# Patient Record
Sex: Female | Born: 1945
Health system: Southern US, Community
[De-identification: ages and names within clinical notes are randomized; demographics above are authoritative.]

## PROBLEM LIST (undated history)

## (undated) DIAGNOSIS — I714 Abdominal aortic aneurysm, without rupture, unspecified: Secondary | ICD-10-CM

## (undated) DIAGNOSIS — Z720 Tobacco use: Secondary | ICD-10-CM

## (undated) DIAGNOSIS — M1990 Unspecified osteoarthritis, unspecified site: Secondary | ICD-10-CM

## (undated) DIAGNOSIS — Z9289 Personal history of other medical treatment: Secondary | ICD-10-CM

## (undated) DIAGNOSIS — M316 Other giant cell arteritis: Secondary | ICD-10-CM

## (undated) DIAGNOSIS — I779 Disorder of arteries and arterioles, unspecified: Secondary | ICD-10-CM

## (undated) DIAGNOSIS — E7800 Pure hypercholesterolemia, unspecified: Secondary | ICD-10-CM

## (undated) DIAGNOSIS — I10 Essential (primary) hypertension: Secondary | ICD-10-CM

## (undated) DIAGNOSIS — I739 Peripheral vascular disease, unspecified: Secondary | ICD-10-CM

## (undated) HISTORY — DX: Personal history of other medical treatment: Z92.89

## (undated) HISTORY — DX: Unspecified osteoarthritis, unspecified site: M19.90

## (undated) HISTORY — DX: Abdominal aortic aneurysm, without rupture: I71.4

## (undated) HISTORY — DX: Disorder of arteries and arterioles, unspecified: I77.9

## (undated) HISTORY — DX: Peripheral vascular disease, unspecified: I73.9

## (undated) HISTORY — PX: HEMORROIDECTOMY: SUR656

## (undated) HISTORY — DX: Abdominal aortic aneurysm, without rupture, unspecified: I71.40

## (undated) HISTORY — DX: Tobacco use: Z72.0

## (undated) HISTORY — PX: BREAST SURGERY: SHX581

## (undated) HISTORY — DX: Essential (primary) hypertension: I10

---

## 2004-01-10 HISTORY — PX: TEMPORAL ARTERY BIOPSY / LIGATION: SUR132

## 2004-04-08 ENCOUNTER — Ambulatory Visit (HOSPITAL_COMMUNITY): Admission: RE | Admit: 2004-04-08 | Discharge: 2004-04-08 | Payer: Self-pay | Admitting: Family Medicine

## 2004-04-20 ENCOUNTER — Ambulatory Visit (HOSPITAL_COMMUNITY): Admission: RE | Admit: 2004-04-20 | Discharge: 2004-04-20 | Payer: Self-pay | Admitting: Internal Medicine

## 2004-07-06 ENCOUNTER — Ambulatory Visit (HOSPITAL_COMMUNITY): Admission: RE | Admit: 2004-07-06 | Discharge: 2004-07-06 | Payer: Self-pay | Admitting: General Surgery

## 2004-07-06 ENCOUNTER — Encounter (INDEPENDENT_AMBULATORY_CARE_PROVIDER_SITE_OTHER): Payer: Self-pay | Admitting: General Surgery

## 2006-01-09 HISTORY — PX: WRIST SURGERY: SHX841

## 2006-08-06 ENCOUNTER — Emergency Department (HOSPITAL_COMMUNITY): Admission: EM | Admit: 2006-08-06 | Discharge: 2006-08-06 | Payer: Self-pay | Admitting: Emergency Medicine

## 2006-08-08 ENCOUNTER — Ambulatory Visit: Payer: Self-pay | Admitting: Orthopedic Surgery

## 2006-08-10 ENCOUNTER — Ambulatory Visit (HOSPITAL_COMMUNITY): Admission: RE | Admit: 2006-08-10 | Discharge: 2006-08-10 | Payer: Self-pay | Admitting: Orthopedic Surgery

## 2006-08-10 ENCOUNTER — Ambulatory Visit: Payer: Self-pay | Admitting: Orthopedic Surgery

## 2006-08-14 ENCOUNTER — Ambulatory Visit: Payer: Self-pay | Admitting: Orthopedic Surgery

## 2006-08-22 ENCOUNTER — Ambulatory Visit: Payer: Self-pay | Admitting: Orthopedic Surgery

## 2006-09-05 ENCOUNTER — Ambulatory Visit: Payer: Self-pay | Admitting: Orthopedic Surgery

## 2006-10-04 ENCOUNTER — Ambulatory Visit: Payer: Self-pay | Admitting: Orthopedic Surgery

## 2006-11-12 ENCOUNTER — Ambulatory Visit: Payer: Self-pay | Admitting: Orthopedic Surgery

## 2006-11-12 DIAGNOSIS — S52539A Colles' fracture of unspecified radius, initial encounter for closed fracture: Secondary | ICD-10-CM | POA: Insufficient documentation

## 2006-11-19 ENCOUNTER — Ambulatory Visit (HOSPITAL_COMMUNITY): Admission: RE | Admit: 2006-11-19 | Discharge: 2006-11-19 | Payer: Self-pay | Admitting: Orthopedic Surgery

## 2006-11-22 ENCOUNTER — Encounter: Payer: Self-pay | Admitting: Orthopedic Surgery

## 2006-12-11 ENCOUNTER — Telehealth: Payer: Self-pay | Admitting: Orthopedic Surgery

## 2008-09-29 ENCOUNTER — Ambulatory Visit (HOSPITAL_COMMUNITY): Admission: RE | Admit: 2008-09-29 | Discharge: 2008-09-29 | Payer: Self-pay | Admitting: Internal Medicine

## 2010-02-08 NOTE — Progress Notes (Signed)
Summary: initial visit  initial visit   Imported By: Jacklynn Ganong 11/09/2006 09:19:13  _____________________________________________________________________  External Attachment:    Type:   Image     Comment:   initial visit

## 2010-05-10 DIAGNOSIS — Z9289 Personal history of other medical treatment: Secondary | ICD-10-CM

## 2010-05-10 HISTORY — DX: Personal history of other medical treatment: Z92.89

## 2010-05-24 NOTE — H&P (Signed)
Elizabeth Meza, Elizabeth Meza                   ACCOUNT NO.:  [id number]   MEDICAL RECORD NO.:  [id number]          PATIENT TYPE:  AMB   LOCATION:  DAY                           FACILITY:  [facility]   PHYSICIAN:  Vickki Hearing, M.D.DATE OF BIRTH:  January 09, 1946   DATE OF ADMISSION:  DATE OF DISCHARGE:  LH                              HISTORY & PHYSICAL   CHIEF COMPLAINT:  Left wrist pain.   HISTORY:  This is a 65 year old female who fell on July 28 and fractured  her left wrist.  Onset of pain was acute.  Severity was a 10, timing  intermittent, quality sharp, with no radiation.  Worsened by movement,  improved with rest.  Associated signs and symptoms:  Swelling.   She is left-hand dominant.   Percocet causes her to be too sedate.  She does not have any medical  problems.  She has had a temporal biopsy for temporal arteritis.  Took  steroids for a year.   FAMILY PHYSICIAN:  Dr. Sherwood Gambler.   Currently takes no medications.   FAMILY HISTORY:  Mother who had a fractured hip.  She has not had a bone  density done yet.   SOCIAL HISTORY:  She is married, retired, does not smoke or drink.  Caffeine use:  Yes.  High school education was completed.   REVIEW OF SYSTEMS:  She complains of occasional headaches, some skin  eczema, seasonal allergies.  Denied constitutional symptoms:  Cardiac,  respiratory, GI symptoms, urinary, endocrine, psychiatric, ear, nose and  throat, or lymphatic system findings.  Weight is 185, pulse 72,  respiratory rate 16.  Appearance was normal.  Peripheral Vascular  System:  No swelling or varicose veins.  Normal pulses and temperature  without edema.  Lymph Nodes:  Cervical normal.  Gait and station normal.   Left wrist is deformed.  It is swollen.  It is tender.  Her range of  motion could not be assessed.  Stability could not be tested.  Strength  could not be tested but muscle tone was normal.   There were no other injuries.  Her other extremities had appropriate  alignment without contracture, subluxation, atrophy or tremor.  Skin x4  normal except for the subcutaneous hemorrhage from the fracture on the  left wrist.   Coordination test had to be deferred.  Deep tendon reflexes had to be  deferred.  Sensation was normal.  She was oriented x3.  Mood and affect  were normal.   The x-rays show a comminuted intra-articular fracture of the left distal  radius.   PLAN:  Is for closed reduction external fixation, percutaneous pinning  on August 10, 2006 with a follow-up scheduled for August 14, 2006.   The informed consent process was completed.  We told her her diagnosis  of a comminuted intra-articular fracture of the left distal radius.  I  told her about the treatment planned, which was closed reduction,  percutaneous pinning.  I told her about the most likely and significant  risks which include stiffness, continued pain, possibility for more  surgery, arthritis, infection.  Benefits of surgery:  Improved alignment and better function of the left  hand.  Alternatives would be casting with, most likely, loss of  reduction and then open treatment with plating which could lead to  further compromise of the bone stock.   I gave the patient the opportunity to ask questions and answered them in  the presence of her husband.      Vickki Hearing, M.D.  Electronically Signed     SEH/MEDQ  D:  08/09/2006  T:  08/09/2006  Job:  [id number]

## 2010-05-24 NOTE — Op Note (Signed)
NAMEREIS, PIENTA                   ACCOUNT NO.:  [id number]   MEDICAL RECORD NO.:  [id number]          PATIENT TYPE:  AMB   LOCATION:  DAY                           FACILITY:  [facility]   PHYSICIAN:  Vickki Hearing, M.D.DATE OF BIRTH:  [birth year]   DATE OF PROCEDURE:  08/10/2006  DATE OF DISCHARGE:                               OPERATIVE REPORT   PREOPERATIVE DIAGNOSIS:  Closed left distal radius fracture.   POSTOPERATIVE DIAGNOSIS:  Closed left distal radius fracture.   PROCEDURE:  Closed reduction, external fixation, percutaneous pinning,  left wrist.   SURGEON:  Vickki Hearing, M.D.   ASSISTANT:  No assistants.   ANESTHETIC:  General.   OPERATIVE FINDINGS:  Comminuted intra-articular fracture with  displacement of left distal radius; fracture was closed.   HISTORY:  This 65 year old female fell and fractured her left wrist,  presented to the office complaining of pain and swelling, radiographs  and clinical exam reviewed, recommended surgery.   The patient was identified in the preop area, her left upper extremity  marked as the surgical site and I countersigned it and updated her  history and physical.  She was taken to surgery, had general anesthesia.  Before manipulation, a time-out procedure was completed to confirm the  procedure.  We then prepped with DuraPrep, draped sterilely, repeated a  closed manipulation under C-arm and then placed one K-wire between the  distal radius and proximal shaft fragment, then applied external  fixation to maintain length, applied a second pin to control rotation.  Radiographs confirmed our reduction.   The fixator was applied by making an incision over the 2nd metacarpal,  taking that down to bone bluntly, applying 2 half pins.  We then did the  same and the distal third of the radius, applied the fixator, and then  checked the reduction under the C-arm on AP and lateral using a flexion  lateral or lunate fossa view as  well.   Wounds were closed with 3-0 nylon, injected with 0.5% plain Marcaine.  Sterile bandages were applied.  The patient was extubated and taken to  recovery room in stable condition.   I would like to keep the fixator on for 8 weeks.   She will come back next week for dressing change.  She is on Vicodin for  pain and some Phenergan in case she gets nauseated.      Vickki Hearing, M.D.  Electronically Signed     SEH/MEDQ  D:  08/10/2006  T:  08/11/2006  Job:  [id number]

## 2010-05-27 NOTE — Op Note (Signed)
Elizabeth, Meza                   ACCOUNT NO.:  [id number]   MEDICAL RECORD NO.:  [id number]          PATIENT TYPE:  AMB   LOCATION:  DAY                           FACILITY:  [facility]   PHYSICIAN:  Barbaraann Barthel, M.D. DATE OF BIRTH:  08-11-1945   DATE OF PROCEDURE:  07/06/2004  DATE OF DISCHARGE:                                 OPERATIVE REPORT   PREPROCEDURE DIAGNOSIS:  Temporal arteritis.   PROCEDURE:  Left temporal artery biopsy.   SPECIMEN:  Left temporal artery.   INDICATIONS:  This is a 65 year old white female who had recurrent left-  sided headaches.  These were primarily left-sided headaches although she had  bilateral headaches, the pain was worse on the worse on the left side.  She  had seen the medical service and then neurology service, and the impression  was that she may have temporal arteritis.  She was referred to me for a  temporal artery biopsy.  At one point it interesting to note that she was  treated with steroids for her pain with some alleviation with a recurrence  of pain with a steroids were discontinued.   GROSS OPERATIVE FINDINGS:  The patient had a somewhat torturous temporal  artery.  We removed at least 1.5 cm and  sent for frozen section, and this  revealed a positive diagnosis with giant cells present.   OPERATIVE TECHNIQUE:  The patient was placed in the supine position with the  head turned to the right side.  An  area around her left temple was prepped  with Betadine solution and draped in the usual manner.  A small amount of  hair was removed this area prior to this.  The artery was encountered using  the handheld Doppler, and the incision was made through skin,  subcutaneous  tissue down to the fascia where the temporal artery was encountered.  This  was isolated superiorly and proximally and ligated with 4-0 silk and removed  and as a specimen for frozen section with the above diagnosis.  The wound  was then irrigated with normal saline  solution and the skin was  reapproximated with subcuticular 4-0 Vicryl suture.  A Steri-Strip was with  Neosporin was applied.  Prior to closure, all sponge needle and instrument  counts were found to be correct.  Estimated blood loss was minimal.  The  patient received 900 mL of crystalloids intraoperatively.  There were no  complications.       WB/MEDQ  D:  07/06/2004  T:  07/06/2004  Job:  [id number]   cc:   Madelin Rear. Sherwood Gambler, MD  [address]  [place]  [place] [address]  Fax: [phone]   Dr. Neale Burly  Neurology Service

## 2010-05-27 NOTE — Procedures (Signed)
NAMETEMPEST, FRANKLAND                   ACCOUNT NO.:  [id number]   MEDICAL RECORD NO.:  [id number]         PATIENT TYPE:  PAMB   LOCATION:                                FACILITY:  [facility]   PHYSICIAN:  Edward L. Juanetta Gosling, M.D.DATE OF BIRTH:  21-Oct-1945   DATE OF PROCEDURE:  07/05/2004  DATE OF DISCHARGE:                                EKG INTERPRETATION   The rhythm is sinus rhythm with a rate in the 80s.  PR interval is short  suggesting accelerated AV conduction.  There is an incomplete right bundle  branch block.  Small Q-waves are seen inferiorly.  Her ST-T wave  abnormalities are nonspecific and diffuse.  Abnormal electrocardiogram.       ELH/MEDQ  D:  07/05/2004  T:  07/06/2004  Job:  [id number]

## 2010-07-22 ENCOUNTER — Encounter: Payer: Self-pay | Admitting: *Deleted

## 2010-07-22 ENCOUNTER — Emergency Department (HOSPITAL_COMMUNITY): Payer: Medicare Other

## 2010-07-22 ENCOUNTER — Emergency Department (HOSPITAL_COMMUNITY)
Admission: EM | Admit: 2010-07-22 | Discharge: 2010-07-22 | Disposition: A | Discharge status: Home / Self Care | Payer: Medicare Other | Attending: Emergency Medicine | Admitting: Emergency Medicine

## 2010-07-22 DIAGNOSIS — N201 Calculus of ureter: Secondary | ICD-10-CM | POA: Insufficient documentation

## 2010-07-22 DIAGNOSIS — F17200 Nicotine dependence, unspecified, uncomplicated: Secondary | ICD-10-CM | POA: Insufficient documentation

## 2010-07-22 DIAGNOSIS — R109 Unspecified abdominal pain: Secondary | ICD-10-CM | POA: Insufficient documentation

## 2010-07-22 HISTORY — DX: Other giant cell arteritis: M31.6

## 2010-07-22 HISTORY — DX: Pure hypercholesterolemia, unspecified: E78.00

## 2010-07-22 LAB — DIFFERENTIAL
Basophils Absolute: 0 10*3/uL (ref 0.0–0.1)
Basophils Relative: 0 % (ref 0–1)
Lymphocytes Relative: 10 % — ABNORMAL LOW (ref 12–46)
Lymphs Abs: 0.9 10*3/uL (ref 0.7–4.0)

## 2010-07-22 LAB — URINE MICROSCOPIC-ADD ON

## 2010-07-22 LAB — BASIC METABOLIC PANEL
BUN: 13 mg/dL (ref 6–23)
GFR calc Af Amer: >60 mL/min (ref >60)
GFR calc non Af Amer: >60 mL/min (ref >60)
Glucose, Bld: 168 mg/dL — ABNORMAL HIGH (ref 70–99)
Sodium: 140 mEq/L (ref 135–145)

## 2010-07-22 LAB — URINALYSIS, ROUTINE W REFLEX MICROSCOPIC
Glucose, UA: NEGATIVE mg/dL
Leukocytes, UA: NEGATIVE
Nitrite: NEGATIVE
Protein, ur: NEGATIVE mg/dL
Specific Gravity, Urine: >1.03 — ABNORMAL HIGH (ref 1.005–1.030)
Urobilinogen, UA: 0.2 mg/dL (ref 0.0–1.0)
pH: 5 (ref 5.0–8.0)

## 2010-07-22 LAB — CBC
Hemoglobin: 14.6 g/dL (ref 12.0–15.0)
MCH: 31.3 pg (ref 26.0–34.0)
RBC: 4.67 MIL/uL (ref 3.87–5.11)
WBC: 8.8 10*3/uL (ref 4.0–10.5)

## 2010-07-22 MED ORDER — ONDANSETRON HCL 4 MG/2ML IJ SOLN
4.0000 mg | Freq: Once | INTRAMUSCULAR | Status: AC
  Administered 2010-07-22: 4 mg via INTRAVENOUS
  Filled 2010-07-22: qty 2

## 2010-07-22 MED ORDER — HYDROMORPHONE HCL 1 MG/ML IJ SOLN
1.0000 mg | Freq: Once | INTRAMUSCULAR | Status: AC
  Administered 2010-07-22: 1 mg via INTRAVENOUS
  Filled 2010-07-22: qty 1

## 2010-07-22 MED ORDER — HYDROCODONE-ACETAMINOPHEN 5-325 MG PO TABS: 1.0000 | ORAL_TABLET | ORAL | Status: AC | PRN

## 2010-07-22 MED ORDER — ONDANSETRON HCL 4 MG PO TABS: 4.0000 mg | ORAL_TABLET | Freq: Four times a day (QID) | ORAL | Status: AC

## 2010-07-22 MED ORDER — SODIUM CHLORIDE 0.9 % IV SOLN
INTRAVENOUS | Status: DC
  Administered 2010-07-22 (×2): via INTRAVENOUS

## 2010-07-22 NOTE — ED Notes (Signed)
Pt c/o right flank pain that radiates around to the abd; pt states she has seen some blood in her urine

## 2010-07-22 NOTE — ED Notes (Signed)
Resting in bed, voices no complaits.

## 2010-07-22 NOTE — ED Provider Notes (Signed)
History     Chief Complaint  Patient presents with  . Flank Pain    right flank pain radiating around to the front   Patient is a 65 y.o. female presenting with flank pain. The history is provided by the patient.  Flank Pain This is a new problem. The current episode started 3 to 5 hours ago. The problem occurs constantly. The problem has been gradually worsening. Associated symptoms include abdominal pain. Pertinent negatives include no chest pain, no headaches and no shortness of breath. The symptoms are aggravated by nothing. The symptoms are relieved by nothing. She has tried nothing for the symptoms.  RIGHT FLANK PAIN RADIATING INTO RLQ AREA. NO HX OF KIDNEY STONES.   Past Medical History  Diagnosis Date  . Hypercholesteremia   . Temporal arteritis     Past Surgical History  Procedure Date  . Wrist surgery   . Hemorroidectomy     History reviewed. No pertinent family history.  History  Substance Use Topics  . Smoking status: Current Some Day Smoker  . Smokeless tobacco: Not on file  . Alcohol Use: No    OB History    Grav Para Term Preterm Abortions TAB SAB Ect Mult Living                  Review of Systems  Constitutional: Negative for fever.  HENT: Negative for neck pain.   Eyes: Negative for redness.  Respiratory: Negative for cough, chest tightness and shortness of breath.   Cardiovascular: Negative for chest pain and palpitations.  Gastrointestinal: Positive for nausea and abdominal pain. Negative for vomiting.  Genitourinary: Positive for hematuria and flank pain.  Musculoskeletal: Positive for back pain.  Skin: Negative for rash.  Neurological: Negative for headaches.  Psychiatric/Behavioral: Negative for confusion.    Physical Exam  BP 117/68  Pulse 69  Temp(Src) 97.8 F (36.6 C) (Oral)  Resp 14  Ht 5\' 3"  (1.6 m)  Wt 190 lb (86.183 kg)  BMI 33.66 kg/m2  SpO2 94%  Physical Exam  Constitutional: She is oriented to person, place, and time.  She appears well-developed and well-nourished.  HENT:  Head: Normocephalic and atraumatic.  Mouth/Throat: Oropharynx is clear and moist.  Eyes: Conjunctivae and EOM are normal. Pupils are equal, round, and reactive to light.  Neck: Normal range of motion. Neck supple.  Cardiovascular: Normal rate, regular rhythm and normal heart sounds.   No murmur heard. Pulmonary/Chest: Effort normal and breath sounds normal. She exhibits no tenderness.  Abdominal: Soft. Bowel sounds are normal. There is no tenderness.  Musculoskeletal: Normal range of motion. She exhibits no edema.  Neurological: She is alert and oriented to person, place, and time. No cranial nerve deficit. She exhibits normal muscle tone.  Skin: Skin is warm and dry. No rash noted.    ED Course  Procedures  MDM IMPROVED IN ED. CT DEPICTS RIGHT SIDED URETERAL STONE ALMOST IN BLADDER. UROLOGY FOLLOW UP PROVIDED. NO UTI.       Shelda Jakes, MD 07/22/10 [phone]

## 2010-07-22 NOTE — ED Notes (Signed)
Up to bathroom, voices no complaints at this time

## 2010-08-29 ENCOUNTER — Other Ambulatory Visit (HOSPITAL_COMMUNITY): Payer: Self-pay | Admitting: Urology

## 2010-08-29 DIAGNOSIS — N201 Calculus of ureter: Secondary | ICD-10-CM

## 2010-09-19 ENCOUNTER — Ambulatory Visit (HOSPITAL_COMMUNITY)
Admission: RE | Admit: 2010-09-19 | Discharge: 2010-09-19 | Disposition: A | Discharge status: Home / Self Care | Payer: Medicare Other | Source: Ambulatory Visit | Attending: Urology | Admitting: Urology

## 2010-09-19 DIAGNOSIS — N201 Calculus of ureter: Secondary | ICD-10-CM

## 2010-09-19 DIAGNOSIS — K5730 Diverticulosis of large intestine without perforation or abscess without bleeding: Secondary | ICD-10-CM | POA: Insufficient documentation

## 2010-09-19 DIAGNOSIS — N200 Calculus of kidney: Secondary | ICD-10-CM | POA: Insufficient documentation

## 2010-09-19 DIAGNOSIS — I77812 Thoracoabdominal aortic ectasia: Secondary | ICD-10-CM | POA: Insufficient documentation

## 2010-09-19 DIAGNOSIS — Q619 Cystic kidney disease, unspecified: Secondary | ICD-10-CM | POA: Insufficient documentation

## 2010-09-19 DIAGNOSIS — R109 Unspecified abdominal pain: Secondary | ICD-10-CM | POA: Insufficient documentation

## 2010-10-07 HISTORY — PX: TRANSTHORACIC ECHOCARDIOGRAM: SHX275

## 2010-10-24 LAB — BASIC METABOLIC PANEL
BUN: 11
CO2: 24
Calcium: 8.8
Chloride: 109
GFR calc non Af Amer: >60

## 2010-10-24 LAB — HEMOGLOBIN AND HEMATOCRIT, BLOOD: HCT: 45.2

## 2011-08-25 IMAGING — CT CT ABD-PELV W/O CM
2 of 4 series · 16 of 46 positions shown, 18 images · non-contrast
Comparison: None.

CLINICAL DATA: Right flank pain

CT ABDOMEN AND PELVIS WITHOUT CONTRAST
TECHNIQUE: Multidetector CT imaging of the abdomen and pelvis was
performed following the standard protocol without intravenous
contrast.

[Series 2: standard/full over (age)lbs 5.0 · axial · 0.78mm/px · z∈[-428,-43]mm · 13 of 85 slices shown, 15 images]
[im 4/85  soft-tissue]
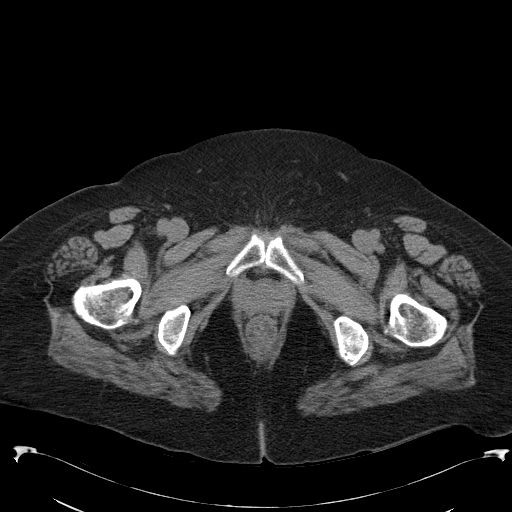
[im 4/85  bone]
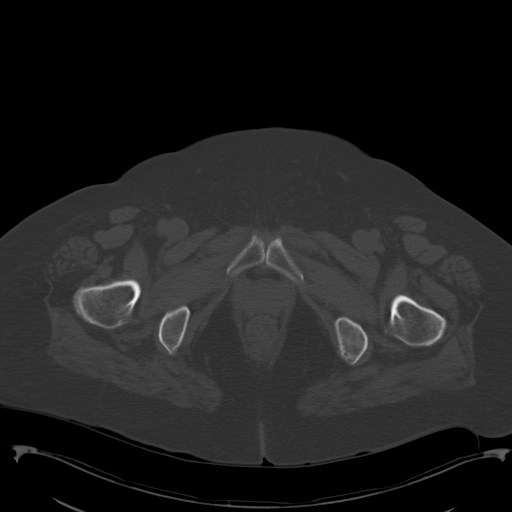
[im 11/85  soft-tissue]
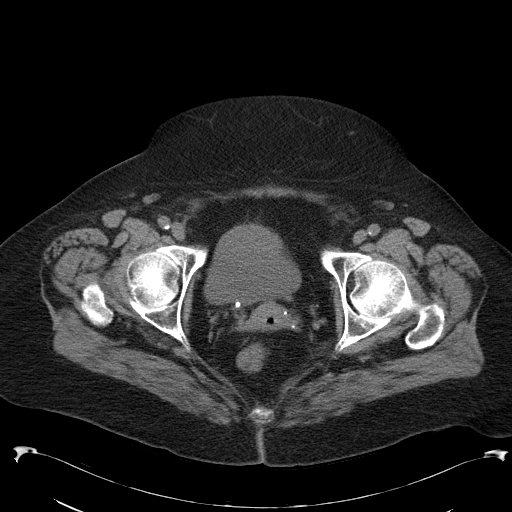
[im 18/85  soft-tissue]
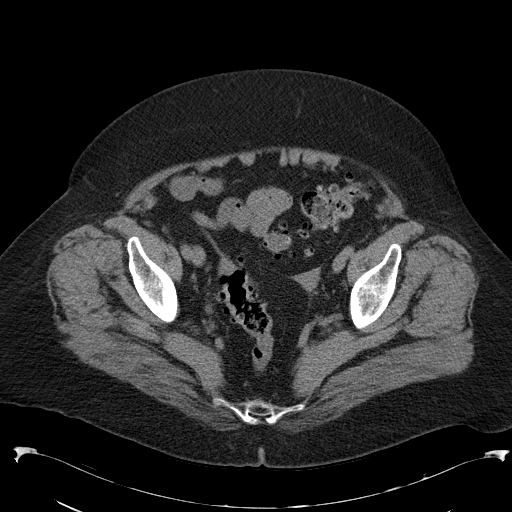
[im 25/85  soft-tissue]
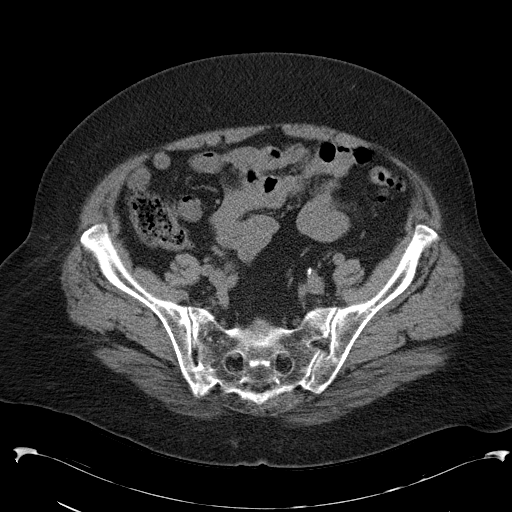
[im 29/85  soft-tissue]
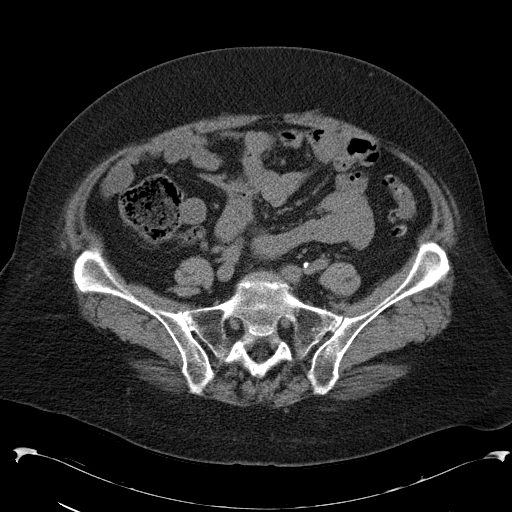
[im 36/85  soft-tissue]
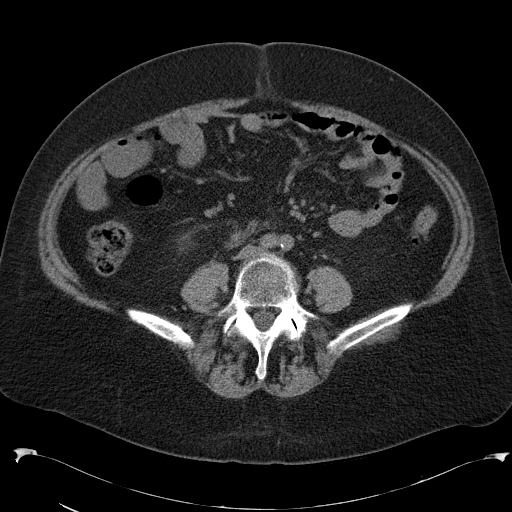
[im 43/85  soft-tissue]
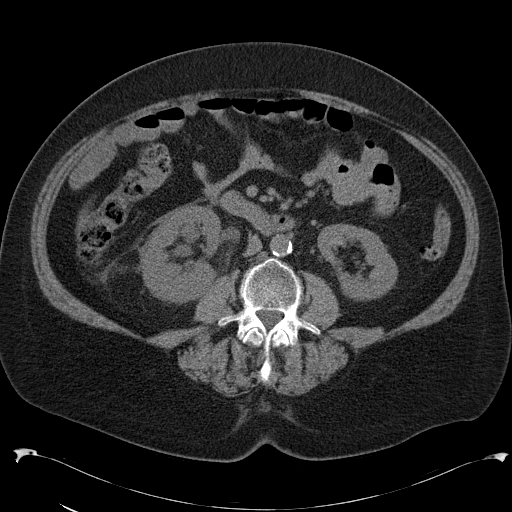
[im 50/85  soft-tissue]
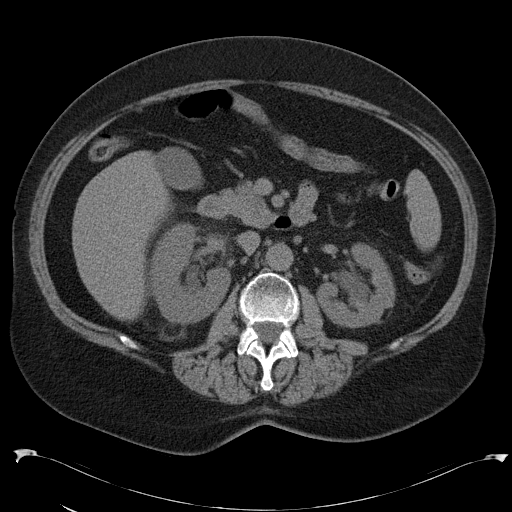
[im 57/85  soft-tissue]
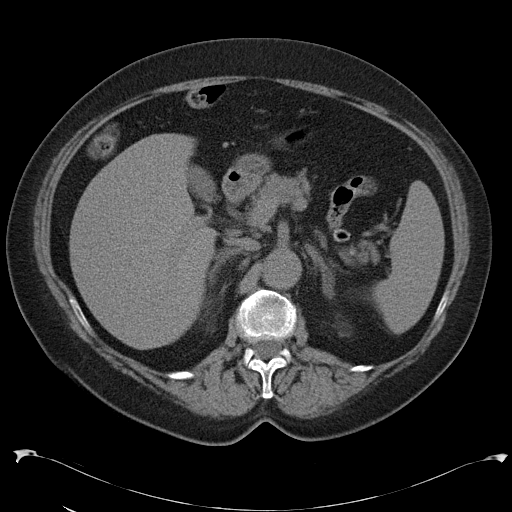
[im 57/85  bone]
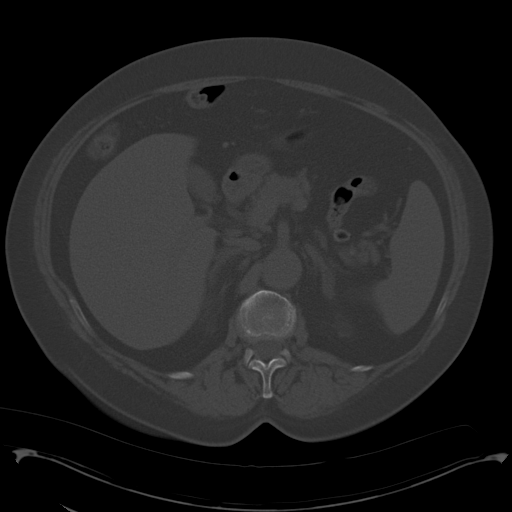
[im 60/85  soft-tissue]
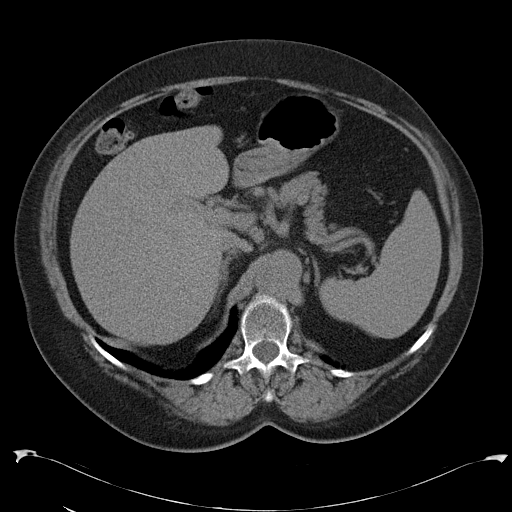
[im 67/85  soft-tissue]
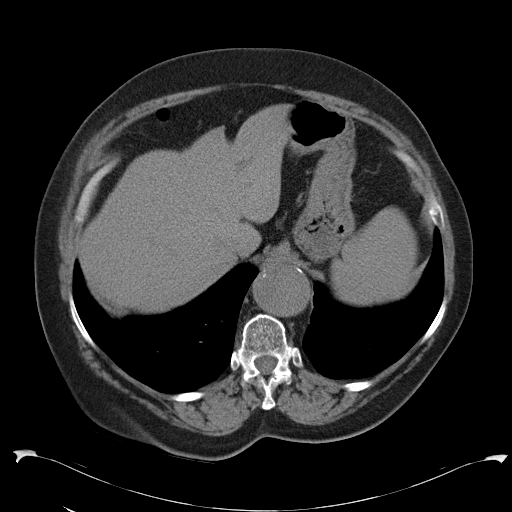
[im 74/85  soft-tissue]
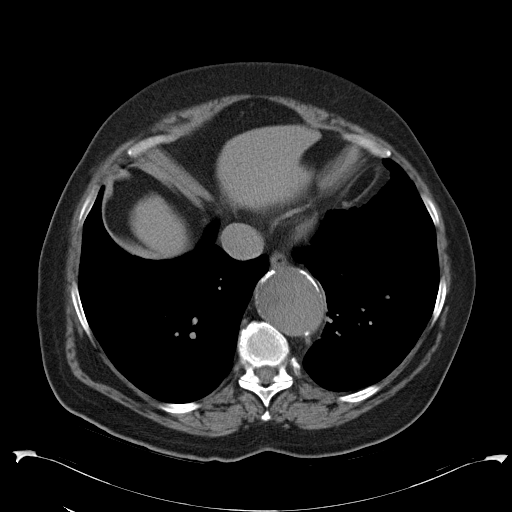
[im 81/85  soft-tissue]
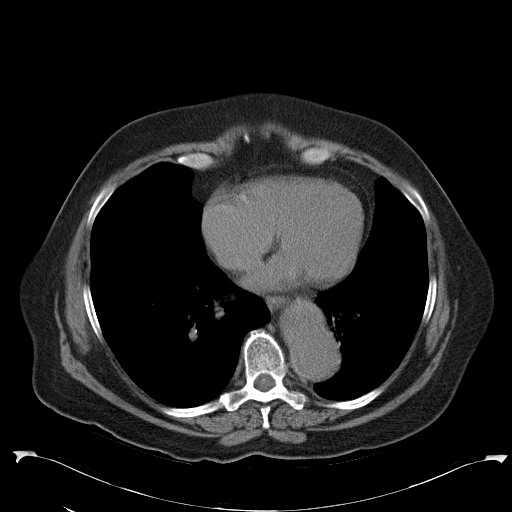

[Series 4: mpr coronal · coronal · 0.74mm/px · 3 of 102 slices shown]
[im 34/102  soft-tissue]
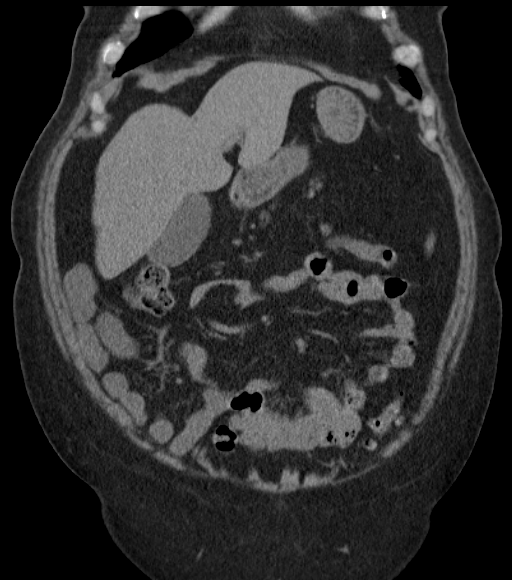
[im 45/102  soft-tissue]
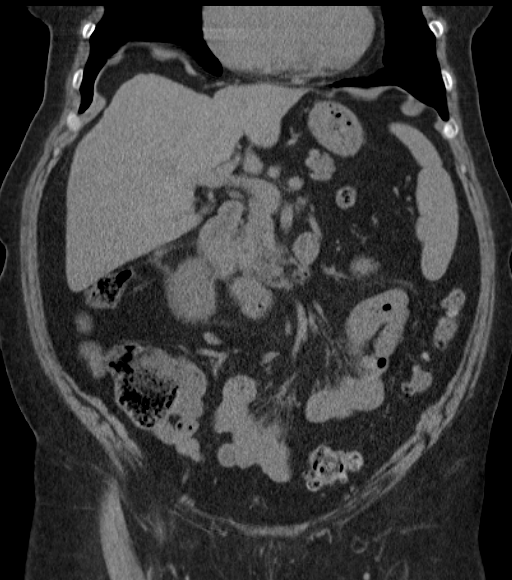
[im 57/102  soft-tissue]
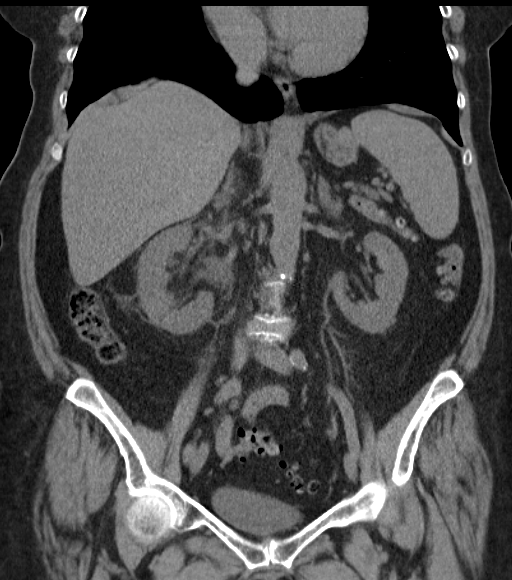

[16 of 46 positions shown; findings below may reference images not displayed]

FINDINGS: There is a 4 mm stone in the distal right ureter at the
ureterovesicle junction with moderate proximal right ureterectasis
and pyelocaliectasis as well as stranding around the right ureter
and right kidney consistent with moderately obstructing stone.  No
intrarenal stones are demonstrated in either kidney.  Parapelvic
cysts are noted on the left kidney.  No bladder stones are
visualized.

The unenhanced liver, spleen, gallbladder, bile ducts, pancreas,
adrenal glands, stomach, small bowel, and retroperitoneal lymph
nodes are unremarkable.  No free fluid or free air in the abdomen.
The abdominal aorta is normal in caliber with mild calcification.
The lower thoracic aorta demonstrates tortuosity and ectasia with
maximal diameter of about 5 cm.

Pelvis:  Diverticulosis of the colon without inflammatory change.
The appendix is normal.  No free or loculated pelvic fluid
collections.  The uterus and adnexal structures are not enlarged.
Mild degenerative changes in the pelvis with intervertebral disc
calcification in the lumbosacral interspace.
IMPRESSION: 4 mm mildly obstructing stone in the distal right ureter at the
ureterovesicle junction.  No stone or obstruction on the left.
Lower thoracic aorta is not dilated and tortuous with maximal
diameter of about 5 cm.

## 2011-10-12 ENCOUNTER — Other Ambulatory Visit (HOSPITAL_COMMUNITY): Payer: Self-pay | Admitting: Internal Medicine

## 2011-10-12 DIAGNOSIS — I714 Abdominal aortic aneurysm, without rupture: Secondary | ICD-10-CM

## 2011-10-12 DIAGNOSIS — M316 Other giant cell arteritis: Secondary | ICD-10-CM | POA: Diagnosis not present

## 2011-10-12 DIAGNOSIS — I719 Aortic aneurysm of unspecified site, without rupture: Secondary | ICD-10-CM | POA: Diagnosis not present

## 2011-10-12 DIAGNOSIS — E782 Mixed hyperlipidemia: Secondary | ICD-10-CM | POA: Diagnosis not present

## 2011-10-13 DIAGNOSIS — Z79899 Other long term (current) drug therapy: Secondary | ICD-10-CM | POA: Diagnosis not present

## 2011-10-13 DIAGNOSIS — E782 Mixed hyperlipidemia: Secondary | ICD-10-CM | POA: Diagnosis not present

## 2011-10-17 ENCOUNTER — Ambulatory Visit (HOSPITAL_COMMUNITY)
Admission: RE | Admit: 2011-10-17 | Discharge: 2011-10-17 | Disposition: A | Discharge status: Home / Self Care | Payer: Medicare Other | Source: Ambulatory Visit | Attending: Internal Medicine | Admitting: Internal Medicine

## 2011-10-17 DIAGNOSIS — I714 Abdominal aortic aneurysm, without rupture, unspecified: Secondary | ICD-10-CM | POA: Diagnosis not present

## 2011-10-17 DIAGNOSIS — I712 Thoracic aortic aneurysm, without rupture, unspecified: Secondary | ICD-10-CM | POA: Diagnosis not present

## 2011-10-17 DIAGNOSIS — I716 Thoracoabdominal aortic aneurysm, without rupture, unspecified: Secondary | ICD-10-CM | POA: Insufficient documentation

## 2011-10-17 MED ORDER — IOHEXOL 350 MG/ML SOLN
100.0000 mL | Freq: Once | INTRAVENOUS | Status: AC | PRN
  Administered 2011-10-17: 100 mL via INTRAVENOUS

## 2011-10-31 ENCOUNTER — Institutional Professional Consult (permissible substitution) (INDEPENDENT_AMBULATORY_CARE_PROVIDER_SITE_OTHER): Payer: Medicare Other | Admitting: Thoracic Surgery (Cardiothoracic Vascular Surgery)

## 2011-10-31 ENCOUNTER — Encounter: Payer: Self-pay | Admitting: *Deleted

## 2011-10-31 ENCOUNTER — Encounter: Payer: Self-pay | Admitting: Thoracic Surgery (Cardiothoracic Vascular Surgery)

## 2011-10-31 VITALS — BP 133/76 | HR 76 | Resp 18 | Ht 62.0 in | Wt 195.0 lb

## 2011-10-31 DIAGNOSIS — I712 Thoracic aortic aneurysm, without rupture, unspecified: Secondary | ICD-10-CM | POA: Diagnosis not present

## 2011-10-31 DIAGNOSIS — I7123 Aneurysm of the descending thoracic aorta, without rupture: Secondary | ICD-10-CM | POA: Insufficient documentation

## 2011-10-31 NOTE — Progress Notes (Signed)
PCP is Cassell Smiles., MD Referring Provider is Chrystie Nose., MD  Chief Complaint  Patient presents with  . Thoracic Aortic Aneurysm    Referral from Dr Rennis Golden for surgical eval on Thoraco-abdominal aneurysm, CTA Chest/ABD/Pelvis on 10/17/11      HPI: Elizabeth Meza is a 66 year old woman who presents with chief complaint of an aneurysm.  Elizabeth Meza was having a CT scan done for evaluation of a kidney stone last year. That scan showed an aneurysm in her upper abdomen that measured 5 cm in diameter. She was advised to have a repeat CT which was done recently. This time CT angiography of the chest and abdomen was performed. It revealed a 5.8 cm a descending aortic aneurysm as well as a complex 5.8 cm descending thoracic aneurysm. The intervening arch is relatively normal in size. She has no symptoms referable to the aneurysms. She denies chest or back pain. She does get short of breath with heavy exertion such as walking up along incline, but does not have shortness of breath with normal activities including walking up a flight of stairs. She does smoke half a pack of cigarettes daily. She says she quit for a while last year, but started again recently.  Past Medical History  Diagnosis Date  . Hypercholesteremia   . Temporal arteritis   . Abdominal aneurysm   . Thoracic aortic aneurysm without mention of rupture   . Arthritis   . Hypertension     Past Surgical History  Procedure Date  . Wrist surgery   . Hemorroidectomy   . Breast surgery     Biopsy    Family History  Problem Relation Age of Onset  . COPD Mother   . Cancer Father   . Heart disease Sister   . Hyperlipidemia Sister   . Hypertension Sister   . Heart disease Brother   . Stroke Maternal Grandfather   . Kidney disease Maternal Grandfather     Social History History  Substance Use Topics  . Smoking status: Current Some Day Smoker -- 0.5 packs/day for 40 years  . Smokeless tobacco: Not on file  . Alcohol Use: No     Current Outpatient Prescriptions  Medication Sig Dispense Refill  . atenolol (TENORMIN) 50 MG tablet Take 50 mg by mouth daily.      Marland Kitchen buPROPion (WELLBUTRIN SR) 150 MG 12 hr tablet Take 150 mg by mouth 2 (two) times daily.        Marland Kitchen doxylamine, Sleep, (UNISOM) 25 MG tablet Take 25 mg by mouth at bedtime as needed.      . meloxicam (MOBIC) 7.5 MG tablet Take 7.5 mg by mouth daily. PRN ONLY      . simvastatin (ZOCOR) 20 MG tablet Take 20 mg by mouth at bedtime.        . vitamin B-12 (CYANOCOBALAMIN) 100 MCG tablet Take 50 mcg by mouth daily.        Allergies  Allergen Reactions  . Sulfa Antibiotics Rash    Review of Systems  Constitutional: Negative.   HENT: Negative.   Eyes:       Floaters  Respiratory: Positive for shortness of breath (with heavy exertion or walking up inclines). Negative for chest tightness.   Cardiovascular: Positive for leg swelling. Negative for chest pain.  Gastrointestinal: Negative.   Genitourinary:       Kidney stones  Musculoskeletal: Positive for arthralgias.  Neurological: Negative.   Hematological: Negative.   All other systems reviewed and are negative.  BP 133/76  Pulse 76  Resp 18  Ht 5\' 2"  (1.575 m)  Wt 195 lb (88.451 kg)  BMI 35.67 kg/m2  SpO2 96% Physical Exam  Vitals reviewed. Constitutional: She is oriented to person, place, and time. No distress.       obese  HENT:  Head: Normocephalic and atraumatic.  Eyes: EOM are normal. Pupils are equal, round, and reactive to light.  Neck: Neck supple. No JVD present. No thyromegaly present.       No bruits  Cardiovascular: Normal rate, regular rhythm and intact distal pulses.  Exam reveals no gallop and no friction rub.   Murmur (faint diastolic murmur) heard. Pulmonary/Chest: Breath sounds normal. She has no wheezes. She has no rales.  Abdominal: Soft. There is no tenderness.  Musculoskeletal: She exhibits no edema.  Lymphadenopathy:    She has no cervical adenopathy.    Neurological: She is alert and oriented to person, place, and time. No cranial nerve deficit.  Skin: Skin is warm and dry.     Diagnostic Tests: CT angiogram of chest and abdomen 10/17/2011 CT ANGIOGRAPHY CHEST, ABDOMEN AND PELVIS  Technique: Multidetector CT imaging through the chest, abdomen and  pelvis was performed using the standard protocol during bolus  administration of intravenous contrast. Multiplanar reconstructed  images including MIPs were obtained and reviewed to evaluate the  vascular anatomy.  Contrast: OMNIPAQUE IOHEXOL 350 MG/ML SOLN  Comparison: 09/19/2010  CTA CHEST  Findings: The initial noncontrast study shows no hyperdense  crescent or other stigmata of impending rupture. No mediastinal  hemorrhage.  Thoracic aorta measures approximately 3.7 cm diameter at the  sinotubular junction, 5.8 cm proximal ascending, 5 cm distal  ascending/proximal arch, 3.7 cm distal arch. There is a bilobed  descending thoracic aortic aneurysm, 5.7 cm proximal descending, 5  cm mid descending, 5.2 cm distal descending, tapering to a 3.5 cm  below the diaphragm.  The CTA shows no dissection. There is eccentric mural thrombus in  the aneurysmal segments of the descending thoracic aorta. No  significant stenosis. There is classic three-vessel brachiocephalic  arterial origin anatomy without proximal stenosis.  No pleural or pericardial effusion. Sub centimeter AP window and  precarinal lymph nodes. No hilar adenopathy. Mild emphysematous  changes in both upper lobes. No focal nodule or infiltrate.  Review of the MIP images confirms the above findings.  IMPRESSION:  1. 5.8 cm ascending and bilobed 5.8 cm descending thoracic aortic  aneurysm. Consider thoracic surgery consultation.  CTA ABDOMEN AND PELVIS  Arterial findings:  Aorta: Dilated to 5.3 cm at the level of the  diaphragm, tapering to 3.4 cm above the renal arteries.  Intraluminal thrombus extends into the  suprarenal segment of the  abdominal aorta without obstruction. The infrarenal aorta is  normal in caliber with mild atheromatous irregularity. No  dissection or stenosis.  Celiac axis: Proximal narrowing at the level of the  median arcuate ligament diaphragm, widely patent distally.  Superior mesenteric:Widely patent with classic distal branching  anatomy.  Left renal: Single, widely patent  Right renal: Single, widely patent.  Inferior mesenteric:Patent  Left iliac: Mild scattered atheromatous plaque in the  through the common iliac. No aneurysm, dissection, or stenosis.  Right iliac: Scattered calcified plaque through the  common iliac without aneurysm, dissection, or stenosis.  Venous findings: Dedicated venous phase imaging was not obtained.  Review of the MIP images confirms the above findings.  Nonvascular findings: Unremarkable arterial phase evaluation of  liver, nondilated gallbladder, spleen, adrenal glands,  kidneys,  pancreas. Stomach and small bowel are nondilated. Normal  appendix. The colon is nondistended with innumerable descending  and sigmoid diverticula; no adjacent inflammatory/edematous change.  Urinary bladder is incompletely distended. Uterus and adnexal  regions unremarkable. No ascites. No free air. No adenopathy  localized. Mild spondylitic changes at the lumbosacral junction.  IMPRESSION:  1. Thoraco-abdominal aneurysm terminates above the renal arteries.  2. Colonic diverticulosis.   Impression: 66 year old woman with 5.8 cm ascending and descending thoracic aortic aneurysms. These both need to be repaired given their size in relation to the relatively normal sized aorta and proximity to them. The ascending aneurysm involves the takeoff of the innominate artery and will require a brief period of circulatory arrest. The aorta appears relatively normal in the vicinity of the sinotubular junction sinuses of Valsalva. She does need to have her aortic valve  assessed as well as catheterization to assess the coronary arteries to determine whether anything needs to be done with the aortic root.. I spoke with Dr. Rennis Golden who will arrange for cardiac catheterization.   Regarding the descending thoracic aorta I think there is a possibility this may be amenable to stent graft procedure. I'll discuss this with Dr. Myra Gianotti to get his thoughts on the subject.  Plan: Patient had cardiac catheterization.  Will discuss possible stent graft approach to descending thoracic aneurysm  I will see her back in the office after catheterization to formulate a final plan.

## 2011-11-09 ENCOUNTER — Encounter (HOSPITAL_COMMUNITY): Payer: Self-pay | Admitting: Pharmacy Technician

## 2011-11-10 ENCOUNTER — Other Ambulatory Visit: Payer: Self-pay | Admitting: *Deleted

## 2011-11-10 DIAGNOSIS — Z79899 Other long term (current) drug therapy: Secondary | ICD-10-CM | POA: Diagnosis not present

## 2011-11-10 DIAGNOSIS — D65 Disseminated intravascular coagulation [defibrination syndrome]: Secondary | ICD-10-CM | POA: Diagnosis not present

## 2011-11-14 ENCOUNTER — Encounter (HOSPITAL_COMMUNITY)
Admission: RE | Disposition: A | Discharge status: Home / Self Care | Payer: Self-pay | Source: Ambulatory Visit | Attending: Internal Medicine

## 2011-11-14 ENCOUNTER — Ambulatory Visit (HOSPITAL_COMMUNITY)
Admission: RE | Admit: 2011-11-14 | Discharge: 2011-11-14 | Disposition: A | Discharge status: Home / Self Care | Payer: Medicare Other | Source: Ambulatory Visit | Attending: Internal Medicine | Admitting: Internal Medicine

## 2011-11-14 DIAGNOSIS — I714 Abdominal aortic aneurysm, without rupture, unspecified: Secondary | ICD-10-CM | POA: Diagnosis not present

## 2011-11-14 DIAGNOSIS — I712 Thoracic aortic aneurysm, without rupture, unspecified: Secondary | ICD-10-CM | POA: Insufficient documentation

## 2011-11-14 HISTORY — PX: LEFT HEART CATHETERIZATION WITH CORONARY ANGIOGRAM: SHX5451

## 2011-11-14 SURGERY — LEFT HEART CATHETERIZATION WITH CORONARY ANGIOGRAM
Anesthesia: LOCAL

## 2011-11-14 MED ORDER — SODIUM CHLORIDE 0.9 % IV SOLN: 250.0000 mL | INTRAVENOUS | Status: DC | PRN

## 2011-11-14 MED ORDER — OXYCODONE-ACETAMINOPHEN 5-325 MG PO TABS: 1.0000 | ORAL_TABLET | ORAL | Status: DC | PRN

## 2011-11-14 MED ORDER — SODIUM CHLORIDE 0.9 % IJ SOLN: 3.0000 mL | Freq: Two times a day (BID) | INTRAMUSCULAR | Status: DC

## 2011-11-14 MED ORDER — MORPHINE SULFATE 4 MG/ML IJ SOLN: 2.0000 mg | INTRAMUSCULAR | Status: DC | PRN

## 2011-11-14 MED ORDER — SODIUM CHLORIDE 0.9 % IJ SOLN: 3.0000 mL | INTRAMUSCULAR | Status: DC | PRN

## 2011-11-14 MED ORDER — ASPIRIN 81 MG PO CHEW
324.0000 mg | CHEWABLE_TABLET | ORAL | Status: AC
  Administered 2011-11-14: 324 mg via ORAL

## 2011-11-14 MED ORDER — SODIUM CHLORIDE 0.9 % IV SOLN
INTRAVENOUS | Status: DC
  Administered 2011-11-14: 09:00:00 via INTRAVENOUS

## 2011-11-14 MED ORDER — HEPARIN (PORCINE) IN NACL 2-0.9 UNIT/ML-% IJ SOLN
INTRAMUSCULAR | Status: AC
  Filled 2011-11-14: qty 1500

## 2011-11-14 MED ORDER — LIDOCAINE HCL (PF) 1 % IJ SOLN
INTRAMUSCULAR | Status: AC
  Filled 2011-11-14: qty 30

## 2011-11-14 MED ORDER — ASPIRIN 81 MG PO CHEW
CHEWABLE_TABLET | ORAL | Status: AC
  Administered 2011-11-14: 324 mg via ORAL
  Filled 2011-11-14: qty 4

## 2011-11-14 MED ORDER — MIDAZOLAM HCL 2 MG/2ML IJ SOLN
INTRAMUSCULAR | Status: AC
  Filled 2011-11-14: qty 2

## 2011-11-14 MED ORDER — ACETAMINOPHEN 325 MG PO TABS: 650.0000 mg | ORAL_TABLET | ORAL | Status: DC | PRN

## 2011-11-14 MED ORDER — FENTANYL CITRATE 0.05 MG/ML IJ SOLN
INTRAMUSCULAR | Status: AC
  Filled 2011-11-14: qty 2

## 2011-11-14 MED ORDER — SODIUM CHLORIDE 0.9 % IV SOLN: 1.0000 mL/kg/h | INTRAVENOUS | Status: AC

## 2011-11-14 MED ORDER — ONDANSETRON HCL 4 MG/2ML IJ SOLN: 4.0000 mg | Freq: Four times a day (QID) | INTRAMUSCULAR | Status: DC | PRN

## 2011-11-14 MED ORDER — NITROGLYCERIN 0.2 MG/ML ON CALL CATH LAB
INTRAVENOUS | Status: AC
  Filled 2011-11-14: qty 1

## 2011-11-14 NOTE — CV Procedure (Addendum)
[facility]     CARDIAC CATHETERIZATION REPORT  Elizabeth Meza   [id number] 12/29/1945  Performing Cardiologist: Chrystie Nose Primary Physician: Cassell Smiles., MD Primary Cardiologist:  Dr. Rennis Golden  Procedures Performed:  Left Heart Catheterization via 5 Fr right femoral artery access  Left Ventriculography, (RAO/LAO) 15 ml/sec for 35 ml total contrast  Native Coronary Angiography  Aortogram  Indication(s): Aortic aneurysm  History: 66 y.o. female with a history of expanding ascending aortic and descending thoracic aortic aneurysms. She is referred for [facility] and aortogram as part of peri-operative planning.  Consent: The procedure with Risks/Benefits/Alternatives and Indications was reviewed with the patient (and family).  All questions were answered.    Risks / Complications include, but not limited to: Death, MI, CVA/TIA, VF/VT (with defibrillation), Bradycardia (need for temporary pacer placement), contrast induced nephropathy, bleeding / bruising / hematoma / pseudoaneurysm, vascular or coronary injury (with possible emergent CT or Vascular Surgery), adverse medication reactions, infection.    The patient ( and family) voice understanding and agree to proceed.    Risks of procedure as well as the alternatives and risks of each were explained to the (patient/caregiver).  Consent for procedure obtained. Consent for signed by MD and patient with RN witness -- placed on chart.  Procedure: The patient was brought to the 2nd Floor [place] [facility] in the fasting state and prepped and draped in the usual sterile fashion for (Right groin access.  Sterile technique was used including antiseptics, cap, gloves, gown, hand hygiene, mask and sheet.  Skin prep: Chlorhexidine;  Time Out: Verified patient identification, verified procedure, site/side was marked, verified correct patient position, special equipment/implants available,  medications/allergies/relevent history reviewed, required imaging and test results available.  Performed  The right femoral head was identified using tactile and fluoroscopic technique.  The right groin was anesthetized with 1% subcutaneous Lidocaine.  The right Common Femoral Artery was accessed using the Modified Seldinger Technique with placement of (5 Fr) sheath using the Seldinger technique.  The sheath was aspirated and flushed.  A 5 Fr JL4 Catheter was advanced of over a Standard J wire into the ascending Aorta.  The catheter was not able to engage the left coronary artery.  This was exchanged over the wire for JL5 and AL3 cathters. Eventually, the AL3 catheter was re-shaped by myself with a larger tertiary curve and successfully engaged the left coronary artery.  Multiple cineangiographic views of the left coronary artery system(s) were performed. A 5 Fr JR4 Catheter was advanced of over a Safety J wire into the ascending Aorta.  The catheter was used to engage the right coronary artery.  Multiple cineangiographic views of the right coronary artery system(s) were performed. This catheter was then exchanged over the Standard J wire for an angled Pigtail catheter that was advanced across the Aortic Valve.  LV hemodynamics were measured and the catheter was pulled back across the Aortic Valve for measurement of "pull-back" gradient.  An aortogram of the aortic root was then obtained. The catheter and the wire were removed completely out of the body.  The patient was transferred to the holding area where the sheath was removed with manual pressure held for hemostasis.   The patient was transported to the cath lab holding area in stable condition.   The patient  was stable before, during and following the procedure.   Patient did tolerate procedure well. There were not complications. EBL: <10 cc  Medications:  Premedication: none  Sedation:  2 mg IV Versed, 50 IV mcg Fentanyl  Contrast:  80 cc  Omnipaque  Hemodynamics:  Central Aortic Pressure / Mean Aortic Pressure: 107/58  LV Pressure / LV End diastolic Pressure:  15  Aortogram:  Markedly dilated aortic root, which measured 5.5 cm at the greatest extent, roughly 3-4 cm above the coronary ostia. There was no appreciable aortic insufficiency.  Coronary Angiographic Data:  Left Main:  Eccentric calcification, ? Mild ostial stenosis in some views, but not in others. Otherwise no significant angiographic obstruction.  Left Anterior Descending (LAD):  Tapers significantly to the mid-vessel and there is a high bifurcation at the mid anterior wall, does not reach the apex. No significant obstructive disease.  1st diagonal (D1):  Large branch which does not appear stenosed.  Circumflex (LCx):  Large vessel without significant disease.  1st obtuse marginal:  Smaller vessel, takes off at the mid-LCX and coarses laterally, no significant stenosis.  Right Coronary Artery: Dominant vessel, late bifurcation of the PDA/PLB system. 20-30% mid-vessel stenosis.  posterior descending artery: No significant stenosis  posterior lateral branch:  No significant stenosis  Impression: 1.  Visualized thoracic aortic aneurysm to 5.5 cm at the distal ascending aorta/proximal aortic arch junction.  No significant aortic insufficiency. 2.  No obstructive coronary disease.  Plan: 1.  Proceed with surgery as per Dr. Dorris Fetch. 2.  Follow-up with me thereafter.  The case and results was discussed with the patient (and family). The case and results was not discussed with the patient's PCP. The case and results was discussed with the patient's Cardiologist.  Time Spend Directly with Patient:  45 minutes  Chrystie Nose, MD, [facility] Attending Cardiologist [facility]  Fatimah Sundquist C 11/14/2011, 10:17 AM

## 2011-11-14 NOTE — H&P (Addendum)
     [facility]          INTERVAL PROCEDURE H&P   History and Physical Interval Note:  11/14/2011 8:19 AM  Reviewed her H&P which was just over 1 month ago .Marland Kitchen She has ascending and descending aortic aneurysms. I discussed the case with Dr. Dorris Fetch and he is requested the standard LHC and root shot prior to surgery to determine if a root or root/valve is necessary.  Elizabeth Meza has presented today for their planned procedure. The various methods of treatment have been discussed with the patient and family. After consideration of risks, benefits and other options for treatment, the patient has consented to the procedure.  The patients' outpatient history has been reviewed, patient examined, and no change in status from most recent office note within the past 30 days. I have reviewed the patients' chart and labs and will proceed as planned. Questions were answered to the patient's satisfaction.   Chrystie Nose, MD, [facility] Attending Cardiologist [facility]  Demere Dotzler C 11/14/2011, 8:19 AM

## 2011-11-21 ENCOUNTER — Ambulatory Visit (INDEPENDENT_AMBULATORY_CARE_PROVIDER_SITE_OTHER): Payer: Medicare Other | Admitting: Thoracic Surgery (Cardiothoracic Vascular Surgery)

## 2011-11-21 ENCOUNTER — Encounter: Payer: Self-pay | Admitting: Thoracic Surgery (Cardiothoracic Vascular Surgery)

## 2011-11-21 VITALS — BP 132/82 | HR 99 | Resp 16 | Ht 62.0 in | Wt 190.0 lb

## 2011-11-21 DIAGNOSIS — I712 Thoracic aortic aneurysm, without rupture, unspecified: Secondary | ICD-10-CM | POA: Diagnosis not present

## 2011-11-21 NOTE — Progress Notes (Signed)
HPI:  Elizabeth Meza returns today to further discuss her ascending and descending thoracic aortic aneurysms.  She was first noted to have aortic pathology a year ago. She was being evaluated for acute onset of back pain and flank pain. A CT scan showed a kidney stone but also showed an enlarged descending thoracic aorta. She was advised to have a followup CT. A CT angiogram was done on 10/17/2011. 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 I saw her in the office on 10/31/2011. In the interim since then she has had cardiac catheterization. It showed no significant coronary disease or aortic insufficiency.  Past Medical History  Diagnosis Date  . Hypercholesteremia   . Temporal arteritis   . Abdominal aneurysm   . Thoracic aortic aneurysm without mention of rupture   . Arthritis   . Hypertension       Current Outpatient Prescriptions  Medication Sig Dispense Refill  . acetaminophen (TYLENOL) 500 MG tablet Take 500 mg by mouth every 6 (six) hours as needed. For pain      . aspirin 81 MG tablet Take 81 mg by mouth daily.      Marland Kitchen atenolol (TENORMIN) 50 MG tablet Take 50 mg by mouth daily.      Marland Kitchen buPROPion (WELLBUTRIN SR) 150 MG 12 hr tablet Take 150 mg by mouth 2 (two) times daily.        . Cyanocobalamin (B-12) 2500 MCG SUBL Place 2,500 mcg under the tongue daily.      . DiphenhydrAMINE HCl, Sleep, (UNISOM SLEEPGELS) 50 MG CAPS Take 50 mg by mouth at bedtime.      . meloxicam (MOBIC) 7.5 MG tablet Take 7.5 mg by mouth daily as needed. For pain      . simvastatin (ZOCOR) 20 MG tablet Take 20 mg by mouth at bedtime.          Physical Exam BP 132/82  Pulse 99  Resp 16  Ht 5\' 2"  (1.575 m)  Wt 190 lb (86.183 kg)  BMI 34.75 kg/m2  SpO52 22% 66 year old woman in no acute distress Exam unchanged  Diagnostic Tests: Cardiac catheterization-results as previously  noted  Impression: 66 year old woman with complex aortic pathology with both ascending and descending aneurysms. In addition she has a chronic dissection of the descending aneurysm with a thrombosed false lumen, which extends into the abdomen.  I have had time to review the films with Dr. Myra Gianotti from vascular surgery. I wanted him to review the films in relation to a possible stent graft of the descending thoracic aorta. The false lumen extends nearly to the level of the renals and definitely involves aorta in the vicinity of the celiac and SMA. He felt that this would require a complex stent graft with stenting of the SMA and celiac and possibly the renals as well. He does not feel that that procedure can be safely done here and recommended referral to another center.  I do think that we can safely do her ascending aortic operation here. However I think it would be wise to have an overall plan prior to embarking on treatment. Therefore, I am going to refer her to Dr. Italy Hughes at [facility] to get his opinion regarding the management of these aneurysms. Elizabeth Meza had questions as to whether this could be done as a single operation or whether the procedures would need to be staged. I  suspect, given the complexity of the descending aneurysm/dissection, staging them would be best, but would defer that decision to Dr. Kizzie Bane. After he has had a chance to evaluate the possibility of stent grafting, we can then decide how best to proceed.   Plan: She will see Dr. Italy Hughes at [facility] I will see her back in 3 weeks (or sooner) after she has seen him

## 2011-11-29 DIAGNOSIS — Z23 Encounter for immunization: Secondary | ICD-10-CM | POA: Diagnosis not present

## 2011-12-29 DIAGNOSIS — I712 Thoracic aortic aneurysm, without rupture, unspecified: Secondary | ICD-10-CM | POA: Diagnosis not present

## 2012-02-19 DIAGNOSIS — I10 Essential (primary) hypertension: Secondary | ICD-10-CM | POA: Diagnosis present

## 2012-02-19 DIAGNOSIS — R222 Localized swelling, mass and lump, trunk: Secondary | ICD-10-CM | POA: Diagnosis not present

## 2012-02-19 DIAGNOSIS — J9383 Other pneumothorax: Secondary | ICD-10-CM | POA: Diagnosis not present

## 2012-02-19 DIAGNOSIS — I712 Thoracic aortic aneurysm, without rupture, unspecified: Secondary | ICD-10-CM | POA: Diagnosis not present

## 2012-02-19 DIAGNOSIS — I517 Cardiomegaly: Secondary | ICD-10-CM | POA: Diagnosis not present

## 2012-02-19 DIAGNOSIS — Z4682 Encounter for fitting and adjustment of non-vascular catheter: Secondary | ICD-10-CM | POA: Diagnosis not present

## 2012-02-19 DIAGNOSIS — I700 Atherosclerosis of aorta: Secondary | ICD-10-CM | POA: Diagnosis not present

## 2012-02-19 DIAGNOSIS — I4891 Unspecified atrial fibrillation: Secondary | ICD-10-CM | POA: Diagnosis not present

## 2012-02-19 DIAGNOSIS — J90 Pleural effusion, not elsewhere classified: Secondary | ICD-10-CM | POA: Diagnosis not present

## 2012-02-19 DIAGNOSIS — E785 Hyperlipidemia, unspecified: Secondary | ICD-10-CM | POA: Diagnosis not present

## 2012-02-19 DIAGNOSIS — R9389 Abnormal findings on diagnostic imaging of other specified body structures: Secondary | ICD-10-CM | POA: Diagnosis not present

## 2012-02-19 DIAGNOSIS — J9819 Other pulmonary collapse: Secondary | ICD-10-CM | POA: Diagnosis not present

## 2012-02-19 DIAGNOSIS — Z006 Encounter for examination for normal comparison and control in clinical research program: Secondary | ICD-10-CM | POA: Diagnosis not present

## 2012-02-19 DIAGNOSIS — F17200 Nicotine dependence, unspecified, uncomplicated: Secondary | ICD-10-CM | POA: Diagnosis present

## 2012-02-22 HISTORY — PX: ABDOMINAL AORTIC ANEURYSM REPAIR: SUR1152

## 2012-03-13 DIAGNOSIS — I712 Thoracic aortic aneurysm, without rupture, unspecified: Secondary | ICD-10-CM | POA: Diagnosis not present

## 2012-03-13 DIAGNOSIS — Z9889 Other specified postprocedural states: Secondary | ICD-10-CM | POA: Diagnosis not present

## 2012-04-17 DIAGNOSIS — M316 Other giant cell arteritis: Secondary | ICD-10-CM | POA: Diagnosis not present

## 2012-05-16 DIAGNOSIS — M316 Other giant cell arteritis: Secondary | ICD-10-CM | POA: Diagnosis not present

## 2012-06-14 DIAGNOSIS — I7411 Embolism and thrombosis of thoracic aorta: Secondary | ICD-10-CM | POA: Diagnosis not present

## 2012-06-14 DIAGNOSIS — K5730 Diverticulosis of large intestine without perforation or abscess without bleeding: Secondary | ICD-10-CM | POA: Diagnosis not present

## 2012-06-14 DIAGNOSIS — I77819 Aortic ectasia, unspecified site: Secondary | ICD-10-CM | POA: Diagnosis not present

## 2012-06-14 DIAGNOSIS — I712 Thoracic aortic aneurysm, without rupture, unspecified: Secondary | ICD-10-CM | POA: Diagnosis not present

## 2012-06-14 DIAGNOSIS — R918 Other nonspecific abnormal finding of lung field: Secondary | ICD-10-CM | POA: Diagnosis not present

## 2012-06-14 DIAGNOSIS — I716 Thoracoabdominal aortic aneurysm, without rupture, unspecified: Secondary | ICD-10-CM | POA: Diagnosis not present

## 2012-06-14 DIAGNOSIS — M316 Other giant cell arteritis: Secondary | ICD-10-CM | POA: Diagnosis not present

## 2012-09-17 DIAGNOSIS — I9589 Other hypotension: Secondary | ICD-10-CM | POA: Diagnosis not present

## 2012-09-17 DIAGNOSIS — E876 Hypokalemia: Secondary | ICD-10-CM | POA: Diagnosis not present

## 2012-09-17 DIAGNOSIS — J9819 Other pulmonary collapse: Secondary | ICD-10-CM | POA: Diagnosis not present

## 2012-09-17 DIAGNOSIS — Z9889 Other specified postprocedural states: Secondary | ICD-10-CM | POA: Diagnosis not present

## 2012-09-17 DIAGNOSIS — E785 Hyperlipidemia, unspecified: Secondary | ICD-10-CM | POA: Diagnosis not present

## 2012-09-17 DIAGNOSIS — E870 Hyperosmolality and hypernatremia: Secondary | ICD-10-CM | POA: Diagnosis not present

## 2012-09-17 DIAGNOSIS — F17200 Nicotine dependence, unspecified, uncomplicated: Secondary | ICD-10-CM | POA: Diagnosis present

## 2012-09-17 DIAGNOSIS — E861 Hypovolemia: Secondary | ICD-10-CM | POA: Diagnosis not present

## 2012-09-17 DIAGNOSIS — F05 Delirium due to known physiological condition: Secondary | ICD-10-CM | POA: Diagnosis not present

## 2012-09-17 DIAGNOSIS — R918 Other nonspecific abnormal finding of lung field: Secondary | ICD-10-CM | POA: Diagnosis not present

## 2012-09-17 DIAGNOSIS — R1312 Dysphagia, oropharyngeal phase: Secondary | ICD-10-CM | POA: Diagnosis not present

## 2012-09-17 DIAGNOSIS — J811 Chronic pulmonary edema: Secondary | ICD-10-CM | POA: Diagnosis not present

## 2012-09-17 DIAGNOSIS — J9383 Other pneumothorax: Secondary | ICD-10-CM | POA: Diagnosis not present

## 2012-09-17 DIAGNOSIS — Z452 Encounter for adjustment and management of vascular access device: Secondary | ICD-10-CM | POA: Diagnosis not present

## 2012-09-17 DIAGNOSIS — I712 Thoracic aortic aneurysm, without rupture, unspecified: Secondary | ICD-10-CM | POA: Diagnosis not present

## 2012-09-17 DIAGNOSIS — I517 Cardiomegaly: Secondary | ICD-10-CM | POA: Diagnosis not present

## 2012-09-17 DIAGNOSIS — I10 Essential (primary) hypertension: Secondary | ICD-10-CM | POA: Diagnosis not present

## 2012-09-17 DIAGNOSIS — M316 Other giant cell arteritis: Secondary | ICD-10-CM | POA: Diagnosis present

## 2012-09-17 DIAGNOSIS — K5909 Other constipation: Secondary | ICD-10-CM | POA: Diagnosis not present

## 2012-09-17 DIAGNOSIS — I716 Thoracoabdominal aortic aneurysm, without rupture, unspecified: Secondary | ICD-10-CM | POA: Diagnosis not present

## 2012-09-17 DIAGNOSIS — J90 Pleural effusion, not elsewhere classified: Secondary | ICD-10-CM | POA: Diagnosis not present

## 2012-09-17 DIAGNOSIS — N209 Urinary calculus, unspecified: Secondary | ICD-10-CM | POA: Diagnosis present

## 2012-09-17 DIAGNOSIS — F29 Unspecified psychosis not due to a substance or known physiological condition: Secondary | ICD-10-CM | POA: Diagnosis not present

## 2012-09-17 DIAGNOSIS — R791 Abnormal coagulation profile: Secondary | ICD-10-CM | POA: Diagnosis present

## 2012-09-17 DIAGNOSIS — G8918 Other acute postprocedural pain: Secondary | ICD-10-CM | POA: Diagnosis not present

## 2012-09-17 DIAGNOSIS — Z4682 Encounter for fitting and adjustment of non-vascular catheter: Secondary | ICD-10-CM | POA: Diagnosis not present

## 2012-09-17 DIAGNOSIS — R404 Transient alteration of awareness: Secondary | ICD-10-CM | POA: Diagnosis not present

## 2012-09-17 DIAGNOSIS — IMO0002 Reserved for concepts with insufficient information to code with codable children: Secondary | ICD-10-CM | POA: Diagnosis not present

## 2012-09-18 DIAGNOSIS — Z9889 Other specified postprocedural states: Secondary | ICD-10-CM | POA: Diagnosis not present

## 2012-09-28 DIAGNOSIS — I2510 Atherosclerotic heart disease of native coronary artery without angina pectoris: Secondary | ICD-10-CM | POA: Diagnosis not present

## 2012-09-28 DIAGNOSIS — Z48812 Encounter for surgical aftercare following surgery on the circulatory system: Secondary | ICD-10-CM | POA: Diagnosis not present

## 2012-09-28 DIAGNOSIS — M159 Polyosteoarthritis, unspecified: Secondary | ICD-10-CM | POA: Diagnosis not present

## 2012-09-28 DIAGNOSIS — R262 Difficulty in walking, not elsewhere classified: Secondary | ICD-10-CM | POA: Diagnosis not present

## 2012-09-30 DIAGNOSIS — R262 Difficulty in walking, not elsewhere classified: Secondary | ICD-10-CM | POA: Diagnosis not present

## 2012-09-30 DIAGNOSIS — I2510 Atherosclerotic heart disease of native coronary artery without angina pectoris: Secondary | ICD-10-CM | POA: Diagnosis not present

## 2012-09-30 DIAGNOSIS — Z48812 Encounter for surgical aftercare following surgery on the circulatory system: Secondary | ICD-10-CM | POA: Diagnosis not present

## 2012-09-30 DIAGNOSIS — M159 Polyosteoarthritis, unspecified: Secondary | ICD-10-CM | POA: Diagnosis not present

## 2012-10-01 DIAGNOSIS — R262 Difficulty in walking, not elsewhere classified: Secondary | ICD-10-CM | POA: Diagnosis not present

## 2012-10-01 DIAGNOSIS — Z48812 Encounter for surgical aftercare following surgery on the circulatory system: Secondary | ICD-10-CM | POA: Diagnosis not present

## 2012-10-01 DIAGNOSIS — M159 Polyosteoarthritis, unspecified: Secondary | ICD-10-CM | POA: Diagnosis not present

## 2012-10-01 DIAGNOSIS — I2510 Atherosclerotic heart disease of native coronary artery without angina pectoris: Secondary | ICD-10-CM | POA: Diagnosis not present

## 2012-10-02 DIAGNOSIS — M159 Polyosteoarthritis, unspecified: Secondary | ICD-10-CM | POA: Diagnosis not present

## 2012-10-02 DIAGNOSIS — Z48812 Encounter for surgical aftercare following surgery on the circulatory system: Secondary | ICD-10-CM | POA: Diagnosis not present

## 2012-10-02 DIAGNOSIS — I2510 Atherosclerotic heart disease of native coronary artery without angina pectoris: Secondary | ICD-10-CM | POA: Diagnosis not present

## 2012-10-02 DIAGNOSIS — R262 Difficulty in walking, not elsewhere classified: Secondary | ICD-10-CM | POA: Diagnosis not present

## 2012-10-04 DIAGNOSIS — M159 Polyosteoarthritis, unspecified: Secondary | ICD-10-CM | POA: Diagnosis not present

## 2012-10-04 DIAGNOSIS — I2510 Atherosclerotic heart disease of native coronary artery without angina pectoris: Secondary | ICD-10-CM | POA: Diagnosis not present

## 2012-10-04 DIAGNOSIS — R262 Difficulty in walking, not elsewhere classified: Secondary | ICD-10-CM | POA: Diagnosis not present

## 2012-10-04 DIAGNOSIS — Z48812 Encounter for surgical aftercare following surgery on the circulatory system: Secondary | ICD-10-CM | POA: Diagnosis not present

## 2012-10-06 DIAGNOSIS — M159 Polyosteoarthritis, unspecified: Secondary | ICD-10-CM | POA: Diagnosis not present

## 2012-10-06 DIAGNOSIS — Z48812 Encounter for surgical aftercare following surgery on the circulatory system: Secondary | ICD-10-CM | POA: Diagnosis not present

## 2012-10-06 DIAGNOSIS — I2510 Atherosclerotic heart disease of native coronary artery without angina pectoris: Secondary | ICD-10-CM | POA: Diagnosis not present

## 2012-10-06 DIAGNOSIS — R262 Difficulty in walking, not elsewhere classified: Secondary | ICD-10-CM | POA: Diagnosis not present

## 2012-10-07 DIAGNOSIS — R262 Difficulty in walking, not elsewhere classified: Secondary | ICD-10-CM | POA: Diagnosis not present

## 2012-10-07 DIAGNOSIS — M159 Polyosteoarthritis, unspecified: Secondary | ICD-10-CM | POA: Diagnosis not present

## 2012-10-07 DIAGNOSIS — I2510 Atherosclerotic heart disease of native coronary artery without angina pectoris: Secondary | ICD-10-CM | POA: Diagnosis not present

## 2012-10-07 DIAGNOSIS — Z48812 Encounter for surgical aftercare following surgery on the circulatory system: Secondary | ICD-10-CM | POA: Diagnosis not present

## 2012-10-08 DIAGNOSIS — M159 Polyosteoarthritis, unspecified: Secondary | ICD-10-CM | POA: Diagnosis not present

## 2012-10-08 DIAGNOSIS — I2510 Atherosclerotic heart disease of native coronary artery without angina pectoris: Secondary | ICD-10-CM | POA: Diagnosis not present

## 2012-10-08 DIAGNOSIS — R262 Difficulty in walking, not elsewhere classified: Secondary | ICD-10-CM | POA: Diagnosis not present

## 2012-10-08 DIAGNOSIS — Z48812 Encounter for surgical aftercare following surgery on the circulatory system: Secondary | ICD-10-CM | POA: Diagnosis not present

## 2012-10-09 DIAGNOSIS — I712 Thoracic aortic aneurysm, without rupture, unspecified: Secondary | ICD-10-CM | POA: Diagnosis not present

## 2012-10-09 DIAGNOSIS — Z9889 Other specified postprocedural states: Secondary | ICD-10-CM | POA: Diagnosis not present

## 2012-10-09 DIAGNOSIS — R9431 Abnormal electrocardiogram [ECG] [EKG]: Secondary | ICD-10-CM | POA: Diagnosis not present

## 2012-10-09 DIAGNOSIS — Z09 Encounter for follow-up examination after completed treatment for conditions other than malignant neoplasm: Secondary | ICD-10-CM | POA: Diagnosis not present

## 2012-10-09 DIAGNOSIS — I716 Thoracoabdominal aortic aneurysm, without rupture, unspecified: Secondary | ICD-10-CM | POA: Diagnosis not present

## 2012-10-09 DIAGNOSIS — I517 Cardiomegaly: Secondary | ICD-10-CM | POA: Diagnosis not present

## 2012-10-10 DIAGNOSIS — Z48812 Encounter for surgical aftercare following surgery on the circulatory system: Secondary | ICD-10-CM | POA: Diagnosis not present

## 2012-10-10 DIAGNOSIS — I2510 Atherosclerotic heart disease of native coronary artery without angina pectoris: Secondary | ICD-10-CM | POA: Diagnosis not present

## 2012-10-10 DIAGNOSIS — R262 Difficulty in walking, not elsewhere classified: Secondary | ICD-10-CM | POA: Diagnosis not present

## 2012-10-10 DIAGNOSIS — M159 Polyosteoarthritis, unspecified: Secondary | ICD-10-CM | POA: Diagnosis not present

## 2012-10-11 DIAGNOSIS — Z48812 Encounter for surgical aftercare following surgery on the circulatory system: Secondary | ICD-10-CM | POA: Diagnosis not present

## 2012-10-11 DIAGNOSIS — M159 Polyosteoarthritis, unspecified: Secondary | ICD-10-CM | POA: Diagnosis not present

## 2012-10-11 DIAGNOSIS — R262 Difficulty in walking, not elsewhere classified: Secondary | ICD-10-CM | POA: Diagnosis not present

## 2012-10-11 DIAGNOSIS — I2510 Atherosclerotic heart disease of native coronary artery without angina pectoris: Secondary | ICD-10-CM | POA: Diagnosis not present

## 2012-10-14 DIAGNOSIS — M159 Polyosteoarthritis, unspecified: Secondary | ICD-10-CM | POA: Diagnosis not present

## 2012-10-14 DIAGNOSIS — I2510 Atherosclerotic heart disease of native coronary artery without angina pectoris: Secondary | ICD-10-CM | POA: Diagnosis not present

## 2012-10-14 DIAGNOSIS — R262 Difficulty in walking, not elsewhere classified: Secondary | ICD-10-CM | POA: Diagnosis not present

## 2012-10-14 DIAGNOSIS — Z48812 Encounter for surgical aftercare following surgery on the circulatory system: Secondary | ICD-10-CM | POA: Diagnosis not present

## 2012-10-16 DIAGNOSIS — R262 Difficulty in walking, not elsewhere classified: Secondary | ICD-10-CM | POA: Diagnosis not present

## 2012-10-16 DIAGNOSIS — M159 Polyosteoarthritis, unspecified: Secondary | ICD-10-CM | POA: Diagnosis not present

## 2012-10-16 DIAGNOSIS — Z48812 Encounter for surgical aftercare following surgery on the circulatory system: Secondary | ICD-10-CM | POA: Diagnosis not present

## 2012-10-16 DIAGNOSIS — I2510 Atherosclerotic heart disease of native coronary artery without angina pectoris: Secondary | ICD-10-CM | POA: Diagnosis not present

## 2012-10-18 DIAGNOSIS — Z48812 Encounter for surgical aftercare following surgery on the circulatory system: Secondary | ICD-10-CM | POA: Diagnosis not present

## 2012-10-18 DIAGNOSIS — M159 Polyosteoarthritis, unspecified: Secondary | ICD-10-CM | POA: Diagnosis not present

## 2012-10-18 DIAGNOSIS — R262 Difficulty in walking, not elsewhere classified: Secondary | ICD-10-CM | POA: Diagnosis not present

## 2012-10-18 DIAGNOSIS — I2510 Atherosclerotic heart disease of native coronary artery without angina pectoris: Secondary | ICD-10-CM | POA: Diagnosis not present

## 2012-10-21 DIAGNOSIS — M159 Polyosteoarthritis, unspecified: Secondary | ICD-10-CM | POA: Diagnosis not present

## 2012-10-21 DIAGNOSIS — I2510 Atherosclerotic heart disease of native coronary artery without angina pectoris: Secondary | ICD-10-CM | POA: Diagnosis not present

## 2012-10-21 DIAGNOSIS — R262 Difficulty in walking, not elsewhere classified: Secondary | ICD-10-CM | POA: Diagnosis not present

## 2012-10-21 DIAGNOSIS — Z48812 Encounter for surgical aftercare following surgery on the circulatory system: Secondary | ICD-10-CM | POA: Diagnosis not present

## 2012-10-23 DIAGNOSIS — I2510 Atherosclerotic heart disease of native coronary artery without angina pectoris: Secondary | ICD-10-CM | POA: Diagnosis not present

## 2012-10-23 DIAGNOSIS — R262 Difficulty in walking, not elsewhere classified: Secondary | ICD-10-CM | POA: Diagnosis not present

## 2012-10-23 DIAGNOSIS — M159 Polyosteoarthritis, unspecified: Secondary | ICD-10-CM | POA: Diagnosis not present

## 2012-10-23 DIAGNOSIS — Z48812 Encounter for surgical aftercare following surgery on the circulatory system: Secondary | ICD-10-CM | POA: Diagnosis not present

## 2012-10-24 DIAGNOSIS — I2510 Atherosclerotic heart disease of native coronary artery without angina pectoris: Secondary | ICD-10-CM | POA: Diagnosis not present

## 2012-10-24 DIAGNOSIS — M159 Polyosteoarthritis, unspecified: Secondary | ICD-10-CM | POA: Diagnosis not present

## 2012-10-24 DIAGNOSIS — Z48812 Encounter for surgical aftercare following surgery on the circulatory system: Secondary | ICD-10-CM | POA: Diagnosis not present

## 2012-10-24 DIAGNOSIS — R262 Difficulty in walking, not elsewhere classified: Secondary | ICD-10-CM | POA: Diagnosis not present

## 2012-10-25 DIAGNOSIS — R262 Difficulty in walking, not elsewhere classified: Secondary | ICD-10-CM | POA: Diagnosis not present

## 2012-10-25 DIAGNOSIS — M159 Polyosteoarthritis, unspecified: Secondary | ICD-10-CM | POA: Diagnosis not present

## 2012-10-25 DIAGNOSIS — I2510 Atherosclerotic heart disease of native coronary artery without angina pectoris: Secondary | ICD-10-CM | POA: Diagnosis not present

## 2012-10-25 DIAGNOSIS — Z48812 Encounter for surgical aftercare following surgery on the circulatory system: Secondary | ICD-10-CM | POA: Diagnosis not present

## 2012-10-28 DIAGNOSIS — M159 Polyosteoarthritis, unspecified: Secondary | ICD-10-CM | POA: Diagnosis not present

## 2012-10-28 DIAGNOSIS — R262 Difficulty in walking, not elsewhere classified: Secondary | ICD-10-CM | POA: Diagnosis not present

## 2012-10-28 DIAGNOSIS — I2510 Atherosclerotic heart disease of native coronary artery without angina pectoris: Secondary | ICD-10-CM | POA: Diagnosis not present

## 2012-10-28 DIAGNOSIS — Z48812 Encounter for surgical aftercare following surgery on the circulatory system: Secondary | ICD-10-CM | POA: Diagnosis not present

## 2012-10-30 ENCOUNTER — Other Ambulatory Visit: Payer: Self-pay | Admitting: *Deleted

## 2012-10-30 DIAGNOSIS — I2510 Atherosclerotic heart disease of native coronary artery without angina pectoris: Secondary | ICD-10-CM | POA: Diagnosis not present

## 2012-10-30 DIAGNOSIS — Z48812 Encounter for surgical aftercare following surgery on the circulatory system: Secondary | ICD-10-CM | POA: Diagnosis not present

## 2012-10-30 DIAGNOSIS — M159 Polyosteoarthritis, unspecified: Secondary | ICD-10-CM | POA: Diagnosis not present

## 2012-10-30 DIAGNOSIS — R262 Difficulty in walking, not elsewhere classified: Secondary | ICD-10-CM | POA: Diagnosis not present

## 2012-10-31 ENCOUNTER — Other Ambulatory Visit: Payer: Self-pay | Admitting: *Deleted

## 2012-10-31 DIAGNOSIS — E7800 Pure hypercholesterolemia, unspecified: Secondary | ICD-10-CM

## 2012-10-31 DIAGNOSIS — Z79899 Other long term (current) drug therapy: Secondary | ICD-10-CM

## 2012-11-04 DIAGNOSIS — R262 Difficulty in walking, not elsewhere classified: Secondary | ICD-10-CM | POA: Diagnosis not present

## 2012-11-04 DIAGNOSIS — M159 Polyosteoarthritis, unspecified: Secondary | ICD-10-CM | POA: Diagnosis not present

## 2012-11-04 DIAGNOSIS — I2510 Atherosclerotic heart disease of native coronary artery without angina pectoris: Secondary | ICD-10-CM | POA: Diagnosis not present

## 2012-11-04 DIAGNOSIS — Z48812 Encounter for surgical aftercare following surgery on the circulatory system: Secondary | ICD-10-CM | POA: Diagnosis not present

## 2012-11-06 DIAGNOSIS — I2510 Atherosclerotic heart disease of native coronary artery without angina pectoris: Secondary | ICD-10-CM | POA: Diagnosis not present

## 2012-11-06 DIAGNOSIS — M159 Polyosteoarthritis, unspecified: Secondary | ICD-10-CM | POA: Diagnosis not present

## 2012-11-06 DIAGNOSIS — Z48812 Encounter for surgical aftercare following surgery on the circulatory system: Secondary | ICD-10-CM | POA: Diagnosis not present

## 2012-11-06 DIAGNOSIS — R262 Difficulty in walking, not elsewhere classified: Secondary | ICD-10-CM | POA: Diagnosis not present

## 2012-11-07 DIAGNOSIS — M159 Polyosteoarthritis, unspecified: Secondary | ICD-10-CM | POA: Diagnosis not present

## 2012-11-07 DIAGNOSIS — I2510 Atherosclerotic heart disease of native coronary artery without angina pectoris: Secondary | ICD-10-CM | POA: Diagnosis not present

## 2012-11-07 DIAGNOSIS — R262 Difficulty in walking, not elsewhere classified: Secondary | ICD-10-CM | POA: Diagnosis not present

## 2012-11-07 DIAGNOSIS — Z48812 Encounter for surgical aftercare following surgery on the circulatory system: Secondary | ICD-10-CM | POA: Diagnosis not present

## 2012-11-11 DIAGNOSIS — M159 Polyosteoarthritis, unspecified: Secondary | ICD-10-CM | POA: Diagnosis not present

## 2012-11-11 DIAGNOSIS — Z48812 Encounter for surgical aftercare following surgery on the circulatory system: Secondary | ICD-10-CM | POA: Diagnosis not present

## 2012-11-11 DIAGNOSIS — R262 Difficulty in walking, not elsewhere classified: Secondary | ICD-10-CM | POA: Diagnosis not present

## 2012-11-11 DIAGNOSIS — I2510 Atherosclerotic heart disease of native coronary artery without angina pectoris: Secondary | ICD-10-CM | POA: Diagnosis not present

## 2012-11-13 DIAGNOSIS — Z48812 Encounter for surgical aftercare following surgery on the circulatory system: Secondary | ICD-10-CM | POA: Diagnosis not present

## 2012-11-13 DIAGNOSIS — I2510 Atherosclerotic heart disease of native coronary artery without angina pectoris: Secondary | ICD-10-CM | POA: Diagnosis not present

## 2012-11-13 DIAGNOSIS — M159 Polyosteoarthritis, unspecified: Secondary | ICD-10-CM | POA: Diagnosis not present

## 2012-11-13 DIAGNOSIS — R262 Difficulty in walking, not elsewhere classified: Secondary | ICD-10-CM | POA: Diagnosis not present

## 2012-11-14 DIAGNOSIS — E7800 Pure hypercholesterolemia, unspecified: Secondary | ICD-10-CM | POA: Diagnosis not present

## 2012-11-14 DIAGNOSIS — Z79899 Other long term (current) drug therapy: Secondary | ICD-10-CM | POA: Diagnosis not present

## 2012-11-14 DIAGNOSIS — Z23 Encounter for immunization: Secondary | ICD-10-CM | POA: Diagnosis not present

## 2012-11-14 LAB — COMPREHENSIVE METABOLIC PANEL
ALT: <8 U/L (ref 0–35)
AST: 13 U/L (ref 0–37)
Albumin: 3.3 g/dL — ABNORMAL LOW (ref 3.5–5.2)
CO2: 27 mEq/L (ref 19–32)
Calcium: 8.9 mg/dL (ref 8.4–10.5)
Chloride: 108 mEq/L (ref 96–112)
Potassium: 3.5 mEq/L (ref 3.5–5.3)
Total Protein: 5.9 g/dL — ABNORMAL LOW (ref 6.0–8.3)

## 2012-11-14 LAB — NMR LIPOPROFILE WITH LIPIDS
HDL Size: 9.6 nm (ref >=9.2)
LDL Size: 20.3 nm — ABNORMAL LOW (ref >20.5)
Large HDL-P: 7.1 umol/L (ref >=4.8)
Large VLDL-P: 2.6 nmol/L (ref <=2.7)
Small LDL Particle Number: 559 nmol/L — ABNORMAL HIGH (ref <=527)
Triglycerides: 112 mg/dL (ref <150)

## 2012-11-15 ENCOUNTER — Encounter: Payer: Self-pay | Admitting: Internal Medicine

## 2012-11-18 DIAGNOSIS — I719 Aortic aneurysm of unspecified site, without rupture: Secondary | ICD-10-CM | POA: Diagnosis not present

## 2012-11-18 DIAGNOSIS — Z6831 Body mass index (BMI) 31.0-31.9, adult: Secondary | ICD-10-CM | POA: Diagnosis not present

## 2012-11-19 ENCOUNTER — Encounter: Payer: Self-pay | Admitting: *Deleted

## 2012-11-20 ENCOUNTER — Encounter: Payer: Self-pay | Admitting: *Deleted

## 2012-11-20 DIAGNOSIS — M159 Polyosteoarthritis, unspecified: Secondary | ICD-10-CM | POA: Diagnosis not present

## 2012-11-20 DIAGNOSIS — R262 Difficulty in walking, not elsewhere classified: Secondary | ICD-10-CM | POA: Diagnosis not present

## 2012-11-20 DIAGNOSIS — Z48812 Encounter for surgical aftercare following surgery on the circulatory system: Secondary | ICD-10-CM | POA: Diagnosis not present

## 2012-11-20 DIAGNOSIS — I2510 Atherosclerotic heart disease of native coronary artery without angina pectoris: Secondary | ICD-10-CM | POA: Diagnosis not present

## 2012-11-21 ENCOUNTER — Encounter: Payer: Self-pay | Admitting: Internal Medicine

## 2012-11-21 ENCOUNTER — Ambulatory Visit (INDEPENDENT_AMBULATORY_CARE_PROVIDER_SITE_OTHER): Payer: Medicare Other | Admitting: Internal Medicine

## 2012-11-21 VITALS — BP 114/60 | HR 99 | Ht 61.5 in | Wt 168.6 lb

## 2012-11-21 DIAGNOSIS — I712 Thoracic aortic aneurysm, without rupture, unspecified: Secondary | ICD-10-CM | POA: Diagnosis not present

## 2012-11-21 DIAGNOSIS — I779 Disorder of arteries and arterioles, unspecified: Secondary | ICD-10-CM

## 2012-11-21 DIAGNOSIS — F17200 Nicotine dependence, unspecified, uncomplicated: Secondary | ICD-10-CM

## 2012-11-21 DIAGNOSIS — E785 Hyperlipidemia, unspecified: Secondary | ICD-10-CM | POA: Diagnosis not present

## 2012-11-21 DIAGNOSIS — I7123 Aneurysm of the descending thoracic aorta, without rupture: Secondary | ICD-10-CM

## 2012-11-21 DIAGNOSIS — I7121 Aneurysm of the ascending aorta, without rupture: Secondary | ICD-10-CM

## 2012-11-21 NOTE — Patient Instructions (Addendum)
Take 1/2 dose of atenolol (25mg  daily) Take 1/2 dose of simvastatin (10mg  daily)  Your physician wants you to follow-up in: 6 months. You will receive a reminder letter in the mail two months in advance. If you don't receive a letter, please call our office to schedule the follow-up appointment.

## 2012-11-22 ENCOUNTER — Encounter: Payer: Self-pay | Admitting: Internal Medicine

## 2012-11-22 DIAGNOSIS — F17200 Nicotine dependence, unspecified, uncomplicated: Secondary | ICD-10-CM | POA: Insufficient documentation

## 2012-11-22 DIAGNOSIS — E785 Hyperlipidemia, unspecified: Secondary | ICD-10-CM | POA: Insufficient documentation

## 2012-11-22 NOTE — Progress Notes (Signed)
OFFICE NOTE  Chief Complaint:  Surgical follow-up  Primary Care Physician: Cassell Smiles., MD  HPI:  Elizabeth Meza  is a 67 year old female with history of temporal arteritis in the past; however, never saw a rheumatologist for that. She also has an abdominal aortic aneurysm measuring up to 5 cm and an ascending aortic aneurysm that measured 4.6 cm on echo. She is a smoker and had been working on smoking cessation, and she quit for a short period of time, however, restarted. She is on Wellbutrin for that. I had referred her to see Dr. Nickola Major in [facility]; however, she never went to that appointment. Her EF is between 50% to 55% with mild diastolic dysfunction.  Her CT scan demonstrated enlargement of her aneurysms and I referred her to Dr. Dorris Fetch. He evaluated her studies and felt that her repair would be too complex and recommended that she be referred to [facility] to see Dr. Italy Hughes.  She ultimately underwent breath abdominal aneurysm repair by Dr. Kizzie Bane with a long hospitalization, but was discharged returns in followup today. Her surgery was deemed successful, however she still has some residual effects including some weakness in the right leg, back pain, and continued fatigue. Dr. Kizzie Bane recommended reducing her atenolol with a plan to try to wean that off. He was taking this medication for her aneurysms and not hypertension. Unfortunately she continues to smoke. She is also complaining of loss of appetite, weight loss and some constipation.  Finally she had recent laboratory work which shows a well-controlled lipid profile. Her LDL particle number is around 800 with an LDL C. of 50. Renal function and the rest of her metabolic profile were within normal limits.  PMHx:  Past Medical History  Diagnosis Date  . Hypercholesteremia   . Temporal arteritis     giant cell arteritis  . Abdominal aneurysm   . Abdominal aortic aneurysm     5cm & ascending aortic aneursym 4.5cm   . Arthritis   . Hypertension   . Tobacco abuse   . Carotid artery disease     mild - carotid doppler 05/2010: mild amount of fibrous plaque in bilat ICAs, occlusive disease of L vertebral   . History of nuclear stress test 05/2010    bruce myoview; no evidence of ischemia, poor exercise tolerance    Past Surgical History  Procedure Laterality Date  . Wrist surgery  2008  . Hemorroidectomy    . Breast surgery      biopsyx3  . Abdominal aortic aneurysm repair  02/22/2012    [facility] - supracoronary ascending aortic & hemi-arch replacement for a 6cm ascending aortic & prox arch aneursym  . Temporal artery biopsy / ligation  2006  . Transthoracic echocardiogram  10/07/2010    EF=50-55%, LV borderline dilated; mild mitral annular calcif; mild TR;     FAMHx:  Family History  Problem Relation Age of Onset  . COPD Mother   . Cancer Father   . Heart disease Sister   . Hyperlipidemia Sister     x3  . Hypertension Sister   . Heart disease Brother     hyperlipidemia  . Stroke Maternal Grandfather   . Kidney disease Maternal Grandfather   . Diabetes Maternal Grandmother   . Stroke Paternal Grandmother   . Heart attack Paternal Grandfather   . Parkinson's disease Sister     also diabetes    SOCHx:   reports that she has been smoking.  She does not  have any smokeless tobacco history on file. She reports that she does not drink alcohol or use illicit drugs.  ALLERGIES:  Allergies  Allergen Reactions  . Septra [Sulfamethoxazole-Tmp Ds]   . Sulfa Antibiotics Rash    ROS: A comprehensive review of systems was negative except for: Constitutional: positive for anorexia and weight loss Gastrointestinal: positive for constipation Musculoskeletal: positive for back pain and right leg weakness  HOME MEDS: Current Outpatient Prescriptions  Medication Sig Dispense Refill  . acetaminophen (TYLENOL) 500 MG tablet Take 500 mg by mouth every 6 (six) hours as needed. For pain      . aspirin 81  MG tablet Take 81 mg by mouth daily.      Marland Kitchen atenolol (TENORMIN) 50 MG tablet Take 25 mg by mouth daily.       . Cyanocobalamin (VITAMIN B-12) 2500 MCG SUBL Place under the tongue daily.      . DiphenhydrAMINE HCl, Sleep, (UNISOM SLEEPGELS) 50 MG CAPS Take 50 mg by mouth at bedtime.      . IBUPROFEN PO Take by mouth as needed.      . simvastatin (ZOCOR) 20 MG tablet Take 10 mg by mouth at bedtime.        No current facility-administered medications for this visit.    LABS/IMAGING: No results found for this or any previous visit (from the past 48 hour(s)). No results found.  VITALS: BP 114/60  Pulse 99  Ht 5' 1.5" (1.562 m)  Wt 168 lb 9.6 oz (76.476 kg)  BMI 31.34 kg/m2  EXAM: General appearance: alert and no distress Neck: no carotid bruit and no JVD Lungs: clear to auscultation bilaterally Heart: regular rate and rhythm, S1, S2 normal, no murmur, click, rub or gallop Abdomen: soft, non-tender; bowel sounds normal; no masses,  no organomegaly Extremities: extremities normal, atraumatic, no cyanosis or edema Pulses: 2+ and symmetric Skin: Skin color, texture, turgor normal. No rashes or lesions Neurologic: Grossly normal Psych:Mood, affect normal  EKG: Sinus rhythm with short PR interval, PACs at 99  ASSESSMENT: 1. History of thoracic abdominal aneurysm status post rectoabdominal aortic aneurysm repair by Dr. Kizzie Bane at [facility] 2. Tobacco dependence 3. Mild carotid artery disease bilaterally 4. Obesity - ongoing weight loss 5. Dyslipidemia - well, if not over-controlled  PLAN: 1.   [name] is recovering from surgery, she has lost a lot of her appetite and notes that her taste is somewhat different since surgery. She's had significant weight loss which is not a bad , however we'll have to follow this closely. She denies any depressive symptoms or lack of interest in prior activities. Unfortunately she continues to smoke and is not interested in quitting. Her lipid profile as  well controlled as not overcontrolled today. I would recommend decreasing her simvastatin to 10 mg daily and cutting her atenolol in half to 25 mg. We'll plan to see her back in 6 months.  Chrystie Nose, MD, [facility] Attending Cardiologist [facility]  Nickie Deren C 11/22/2012, 10:43 AM

## 2012-11-25 DIAGNOSIS — M159 Polyosteoarthritis, unspecified: Secondary | ICD-10-CM | POA: Diagnosis not present

## 2012-11-25 DIAGNOSIS — I2510 Atherosclerotic heart disease of native coronary artery without angina pectoris: Secondary | ICD-10-CM | POA: Diagnosis not present

## 2012-11-25 DIAGNOSIS — R262 Difficulty in walking, not elsewhere classified: Secondary | ICD-10-CM | POA: Diagnosis not present

## 2012-11-25 DIAGNOSIS — Z48812 Encounter for surgical aftercare following surgery on the circulatory system: Secondary | ICD-10-CM | POA: Diagnosis not present

## 2013-01-23 DIAGNOSIS — B029 Zoster without complications: Secondary | ICD-10-CM | POA: Diagnosis not present

## 2013-01-23 DIAGNOSIS — Z6828 Body mass index (BMI) 28.0-28.9, adult: Secondary | ICD-10-CM | POA: Diagnosis not present

## 2013-04-09 DIAGNOSIS — I716 Thoracoabdominal aortic aneurysm, without rupture, unspecified: Secondary | ICD-10-CM | POA: Diagnosis not present

## 2013-04-09 DIAGNOSIS — Z09 Encounter for follow-up examination after completed treatment for conditions other than malignant neoplasm: Secondary | ICD-10-CM | POA: Diagnosis not present

## 2013-04-09 DIAGNOSIS — I712 Thoracic aortic aneurysm, without rupture, unspecified: Secondary | ICD-10-CM | POA: Diagnosis not present

## 2013-04-09 DIAGNOSIS — M954 Acquired deformity of chest and rib: Secondary | ICD-10-CM | POA: Diagnosis not present

## 2013-04-09 DIAGNOSIS — Q766 Other congenital malformations of ribs: Secondary | ICD-10-CM | POA: Diagnosis not present

## 2013-04-09 DIAGNOSIS — Z8679 Personal history of other diseases of the circulatory system: Secondary | ICD-10-CM | POA: Diagnosis not present

## 2013-04-09 DIAGNOSIS — E279 Disorder of adrenal gland, unspecified: Secondary | ICD-10-CM | POA: Diagnosis not present

## 2013-04-09 DIAGNOSIS — R935 Abnormal findings on diagnostic imaging of other abdominal regions, including retroperitoneum: Secondary | ICD-10-CM | POA: Diagnosis not present

## 2013-05-13 DIAGNOSIS — H26019 Infantile and juvenile cortical, lamellar, or zonular cataract, unspecified eye: Secondary | ICD-10-CM | POA: Diagnosis not present

## 2013-05-13 DIAGNOSIS — H2510 Age-related nuclear cataract, unspecified eye: Secondary | ICD-10-CM | POA: Diagnosis not present

## 2013-05-21 DIAGNOSIS — H2510 Age-related nuclear cataract, unspecified eye: Secondary | ICD-10-CM | POA: Diagnosis not present

## 2013-05-21 DIAGNOSIS — IMO0002 Reserved for concepts with insufficient information to code with codable children: Secondary | ICD-10-CM | POA: Diagnosis not present

## 2013-05-26 ENCOUNTER — Telehealth: Payer: Self-pay | Admitting: *Deleted

## 2013-05-26 ENCOUNTER — Encounter: Payer: Self-pay | Admitting: Internal Medicine

## 2013-05-26 ENCOUNTER — Ambulatory Visit (INDEPENDENT_AMBULATORY_CARE_PROVIDER_SITE_OTHER): Payer: Medicare Other | Admitting: Internal Medicine

## 2013-05-26 VITALS — BP 126/70 | HR 78 | Ht 61.5 in | Wt 167.3 lb

## 2013-05-26 DIAGNOSIS — I7121 Aneurysm of the ascending aorta, without rupture: Secondary | ICD-10-CM

## 2013-05-26 DIAGNOSIS — M316 Other giant cell arteritis: Secondary | ICD-10-CM

## 2013-05-26 DIAGNOSIS — I712 Thoracic aortic aneurysm, without rupture, unspecified: Secondary | ICD-10-CM

## 2013-05-26 DIAGNOSIS — E785 Hyperlipidemia, unspecified: Secondary | ICD-10-CM

## 2013-05-26 DIAGNOSIS — I7123 Aneurysm of the descending thoracic aorta, without rupture: Secondary | ICD-10-CM

## 2013-05-26 DIAGNOSIS — E782 Mixed hyperlipidemia: Secondary | ICD-10-CM | POA: Diagnosis not present

## 2013-05-26 DIAGNOSIS — F17200 Nicotine dependence, unspecified, uncomplicated: Secondary | ICD-10-CM | POA: Diagnosis not present

## 2013-05-26 DIAGNOSIS — I779 Disorder of arteries and arterioles, unspecified: Secondary | ICD-10-CM | POA: Diagnosis not present

## 2013-05-26 DIAGNOSIS — I739 Peripheral vascular disease, unspecified: Secondary | ICD-10-CM

## 2013-05-26 MED ORDER — ATENOLOL 25 MG PO TABS
25.0000 mg | ORAL_TABLET | Freq: Two times a day (BID) | ORAL | Status: DC
Start: 2013-05-26 — End: 2013-05-27

## 2013-05-26 MED ORDER — SIMVASTATIN 20 MG PO TABS: 10.0000 mg | ORAL_TABLET | Freq: Every day | ORAL | Status: DC

## 2013-05-26 NOTE — Patient Instructions (Signed)
Dr Debara Pickett has ordered the following test(s) to be done:  Lab work - Lipid panel  Your physician has recommended making the following medication changes:  RESTART Atenolol 25 mg - take 1 tablet daily  Dr Debara Pickett wants you to follow-up in 6 months. You will receive a reminder letter in the mail one months in advance. If you don't receive a letter, please call our office to schedule the follow-up appointment.

## 2013-05-26 NOTE — Progress Notes (Signed)
OFFICE NOTE  Chief Complaint:  Surgical follow-up  Primary Care Physician: Glo Herring., MD  HPI:  Elizabeth Meza  is a 68 year old female with history of temporal arteritis in the past; however, never saw a rheumatologist for that. She also has an abdominal aortic aneurysm measuring up to 5 cm and an ascending aortic aneurysm that measured 4.6 cm on echo. She is a smoker and had been working on smoking cessation, and she quit for a short period of time, however, restarted. She is on Wellbutrin for that. I had referred her to see Dr. Trudie Reed in [facility]; however, she never went to that appointment. Her EF is between 50% to 55% with mild diastolic dysfunction.  Her CT scan demonstrated enlargement of her aneurysms and I referred her to Dr. Roxan Hockey. He evaluated her studies and felt that her repair would be too complex and recommended that she be referred to [facility] to see Dr. Mali Hughes.  She ultimately underwent breath abdominal aneurysm repair by Dr. Ysidro Evert with a long hospitalization, but was discharged returns in followup today. Her surgery was deemed successful, however she still has some residual effects including some weakness in the right leg, back pain, and continued fatigue. Dr. Ysidro Evert recommended reducing her atenolol with a plan to try to wean that off. He was taking this medication for her aneurysms and not hypertension. Unfortunately she continues to smoke. She is also complaining of loss of appetite, weight loss and some constipation.  Finally she had recent laboratory work which shows a well-controlled lipid profile. Her LDL particle number is around 800 with an LDL C. of 50. Renal function and the rest of her metabolic profile were within normal limits.  Elizabeth Meza returns for followup again today. I recently received the data from [facility] including a CT scan which shows stability of her rectal abdominal aortic repair. She has been weaned off  of atenolol but is having some palpitations and PACs. She has the diagnosis of giant cell arteritis which is confirmed by temporal artery biopsy as well as biopsy from the aorta post surgical. She has been followed by Dr. Annye Asa with rheumatology. She's currently not on steroids.  She is planning and getting bilateral cataract surgery in the near future with intraocular lens implant to correct her astigmatism.  PMHx:  Past Medical History  Diagnosis Date  . Hypercholesteremia   . Temporal arteritis     giant cell arteritis  . Abdominal aneurysm   . Abdominal aortic aneurysm     5cm & ascending aortic aneursym 4.5cm  . Arthritis   . Hypertension   . Tobacco abuse   . Carotid artery disease     mild - carotid doppler 05/2010: mild amount of fibrous plaque in bilat ICAs, occlusive disease of L vertebral   . History of nuclear stress test 05/2010    bruce myoview; no evidence of ischemia, poor exercise tolerance    Past Surgical History  Procedure Laterality Date  . Wrist surgery  2008  . Hemorroidectomy    . Breast surgery      biopsyx3  . Abdominal aortic aneurysm repair  02/22/2012    [facility] - supracoronary ascending aortic & hemi-arch replacement for a 6cm ascending aortic & prox arch aneursym  . Temporal artery biopsy / ligation  2006  . Transthoracic echocardiogram  10/07/2010    EF=50-55%, LV borderline dilated; mild  mitral annular calcif; mild TR;     FAMHx:  Family History  Problem Relation Age of Onset  . COPD Mother   . Cancer Father   . Heart disease Sister   . Hyperlipidemia Sister     x3  . Hypertension Sister   . Heart disease Brother     hyperlipidemia  . Stroke Maternal Grandfather   . Kidney disease Maternal Grandfather   . Diabetes Maternal Grandmother   . Stroke Paternal Grandmother   . Heart attack Paternal Grandfather   . Parkinson's disease Sister     also diabetes    SOCHx:   reports that she has been smoking Cigarettes.  She has a 20  pack-year smoking history. She does not have any smokeless tobacco history on file. She reports that she does not drink alcohol or use illicit drugs.  ALLERGIES:  Allergies  Allergen Reactions  . Septra [Sulfamethoxazole-Tmp Ds]   . Sulfa Antibiotics Rash    ROS: A comprehensive review of systems was negative.  HOME MEDS: Current Outpatient Prescriptions  Medication Sig Dispense Refill  . acetaminophen (TYLENOL) 500 MG tablet Take 500 mg by mouth every 6 (six) hours as needed. For pain      . aspirin 81 MG tablet Take 81 mg by mouth daily.      . Cyanocobalamin (VITAMIN B-12) 2500 MCG SUBL Place under the tongue daily.      . DiphenhydrAMINE HCl, Sleep, (UNISOM SLEEPGELS) 50 MG CAPS Take 50 mg by mouth at bedtime.      . IBUPROFEN PO Take by mouth as needed.      Marland Kitchen ofloxacin (OCUFLOX) 0.3 % ophthalmic solution as directed. For Cataract Surgery on 5/20      . simvastatin (ZOCOR) 20 MG tablet Take 0.5 tablets (10 mg total) by mouth at bedtime.  15 tablet  11  . atenolol (TENORMIN) 25 MG tablet Take 1 tablet (25 mg total) by mouth 2 (two) times daily.  30 tablet  11   No current facility-administered medications for this visit.    LABS/IMAGING: No results found for this or any previous visit (from the past 48 hour(s)). No results found.  VITALS: BP 126/70  Pulse 78  Ht 5' 1.5" (1.562 m)  Wt 167 lb 4.8 oz (75.887 kg)  BMI 31.10 kg/m2  EXAM: General appearance: alert and no distress Neck: no carotid bruit and no JVD Lungs: clear to auscultation bilaterally Heart: regular rate and rhythm, S1, S2 normal, no murmur, click, rub or gallop Abdomen: soft, non-tender; bowel sounds normal; no masses,  no organomegaly Extremities: extremities normal, atraumatic, no cyanosis or edema Pulses: 2+ and symmetric Skin: Skin color, texture, turgor normal. No rashes or lesions Neurologic: Grossly normal Psych:Mood, affect normal  EKG: Sinus rhythm with short PR interval, PACs at  81  ASSESSMENT: 1. History of thoracic abdominal aneurysm status post rectoabdominal aortic aneurysm repair by Dr. Ysidro Evert at [facility] 2. Tobacco dependence 3. Mild carotid artery disease bilaterally 4. Obesity - ongoing weight loss 5. Dyslipidemia - well, if not over-controlled 6. Giant cell arteritis 7. PACs  PLAN: 1.   Elizabeth Meza is recovering from surgery, she has lost a lot of her appetite and notes that her taste is somewhat different since surgery. She's had significant weight loss which is not a bad , however we'll have to follow this closely. She denies any depressive symptoms or lack of interest in prior activities. Unfortunately she continues to smoke and is not interested in quitting. Cholesterol  appears well controlled. She has come off of atenolol but it has unmasked PACs for which she is symptomatic. I would like her to start back on low-dose atenolol 25 mg daily. She is followed by Dr. Ouida Sills for her giant cell arteritis which is being treated by steroids. I think she is low risk for her cataract surgery and intraocular lens implant. I will see her back in 6 months.  Pixie Casino, MD, [facility] Attending Cardiologist [place] 05/26/2013, 1:39 PM

## 2013-05-26 NOTE — Telephone Encounter (Signed)
Patient requested that her EKG be sent to Dr. Mali Hughes. EKG from 05/26/13 faxed per patient request. Medical Release Form Signed.

## 2013-05-27 ENCOUNTER — Telehealth: Payer: Self-pay

## 2013-05-27 MED ORDER — SIMVASTATIN 20 MG PO TABS: 10.0000 mg | ORAL_TABLET | Freq: Every day | ORAL | Status: DC

## 2013-05-27 MED ORDER — ATENOLOL 25 MG PO TABS: 25.0000 mg | ORAL_TABLET | Freq: Every day | ORAL | Status: DC

## 2013-05-27 NOTE — Telephone Encounter (Signed)
Jonni Sanger, from [place], called to clarify directions of patient's medications that were sent in yesterday. Also request 90-day supply.

## 2013-05-28 DIAGNOSIS — H26019 Infantile and juvenile cortical, lamellar, or zonular cataract, unspecified eye: Secondary | ICD-10-CM | POA: Diagnosis not present

## 2013-05-28 DIAGNOSIS — H2510 Age-related nuclear cataract, unspecified eye: Secondary | ICD-10-CM | POA: Diagnosis not present

## 2013-06-18 DIAGNOSIS — T8529XA Other mechanical complication of intraocular lens, initial encounter: Secondary | ICD-10-CM | POA: Diagnosis not present

## 2013-06-18 DIAGNOSIS — Y839 Surgical procedure, unspecified as the cause of abnormal reaction of the patient, or of later complication, without mention of misadventure at the time of the procedure: Secondary | ICD-10-CM | POA: Diagnosis not present

## 2013-07-09 DIAGNOSIS — H2510 Age-related nuclear cataract, unspecified eye: Secondary | ICD-10-CM | POA: Diagnosis not present

## 2013-07-09 DIAGNOSIS — H26019 Infantile and juvenile cortical, lamellar, or zonular cataract, unspecified eye: Secondary | ICD-10-CM | POA: Diagnosis not present

## 2013-08-06 DIAGNOSIS — T8529XA Other mechanical complication of intraocular lens, initial encounter: Secondary | ICD-10-CM | POA: Diagnosis not present

## 2013-10-30 DIAGNOSIS — Z23 Encounter for immunization: Secondary | ICD-10-CM | POA: Diagnosis not present

## 2013-11-19 ENCOUNTER — Telehealth: Payer: Self-pay | Admitting: Internal Medicine

## 2013-11-19 DIAGNOSIS — E782 Mixed hyperlipidemia: Secondary | ICD-10-CM | POA: Diagnosis not present

## 2013-11-19 LAB — LIPID PANEL
Cholesterol: 161 mg/dL (ref 0–200)
HDL: 53 mg/dL (ref >39)
LDL Cholesterol: 86 mg/dL (ref 0–99)
TRIGLYCERIDES: 108 mg/dL (ref <150)
Total CHOL/HDL Ratio: 3 Ratio
VLDL: 22 mg/dL (ref 0–40)

## 2013-11-20 ENCOUNTER — Encounter: Payer: Self-pay | Admitting: *Deleted

## 2013-11-20 NOTE — Telephone Encounter (Signed)
Close encounter 

## 2013-11-24 ENCOUNTER — Ambulatory Visit (INDEPENDENT_AMBULATORY_CARE_PROVIDER_SITE_OTHER): Payer: Medicare Other | Admitting: Internal Medicine

## 2013-11-24 ENCOUNTER — Encounter: Payer: Self-pay | Admitting: Internal Medicine

## 2013-11-24 VITALS — BP 100/70 | HR 73 | Ht 61.5 in | Wt 173.4 lb

## 2013-11-24 DIAGNOSIS — I712 Thoracic aortic aneurysm, without rupture: Secondary | ICD-10-CM

## 2013-11-24 DIAGNOSIS — F17200 Nicotine dependence, unspecified, uncomplicated: Secondary | ICD-10-CM | POA: Diagnosis not present

## 2013-11-24 DIAGNOSIS — M316 Other giant cell arteritis: Secondary | ICD-10-CM

## 2013-11-24 DIAGNOSIS — E785 Hyperlipidemia, unspecified: Secondary | ICD-10-CM | POA: Diagnosis not present

## 2013-11-24 DIAGNOSIS — I7121 Aneurysm of the ascending aorta, without rupture: Secondary | ICD-10-CM

## 2013-11-24 DIAGNOSIS — I7123 Aneurysm of the descending thoracic aorta, without rupture: Secondary | ICD-10-CM

## 2013-11-24 NOTE — Patient Instructions (Signed)
Your physician wants you to follow-up in: 1 year with Dr. Hilty. You will receive a reminder letter in the mail two months in advance. If you don't receive a letter, please call our office to schedule the follow-up appointment.  

## 2013-11-24 NOTE — Progress Notes (Signed)
OFFICE NOTE  Chief Complaint:  Surgical follow-up  Primary Care Physician: Glo Herring., MD  HPI:  Elizabeth Meza  is a 68 year old female with history of temporal arteritis in the past; however, never saw a rheumatologist for that. She also has an abdominal aortic aneurysm measuring up to 5 cm and an ascending aortic aneurysm that measured 4.6 cm on echo. She is a smoker and had been working on smoking cessation, and she quit for a short period of time, however, restarted. She is on Wellbutrin for that. I had referred her to see Dr. Trudie Reed in [facility]; however, she never went to that appointment. Her EF is between 50% to 55% with mild diastolic dysfunction.  Her CT scan demonstrated enlargement of her aneurysms and I referred her to Dr. Roxan Hockey. He evaluated her studies and felt that her repair would be too complex and recommended that she be referred to [facility] to see Dr. Mali Hughes.  She ultimately underwent breath abdominal aneurysm repair by Dr. Ysidro Evert with a long hospitalization, but was discharged returns in followup today. Her surgery was deemed successful, however she still has some residual effects including some weakness in the right leg, back pain, and continued fatigue. Dr. Ysidro Evert recommended reducing her atenolol with a plan to try to wean that off. He was taking this medication for her aneurysms and not hypertension. Unfortunately she continues to smoke. She is also complaining of loss of appetite, weight loss and some constipation.  Finally she had recent laboratory work which shows a well-controlled lipid profile. Her LDL particle number is around 800 with an LDL C. of 50. Renal function and the rest of her metabolic profile were within normal limits.  [name] returns for followup again today. I recently received the data from [facility] including a CT scan which shows stability of her rectal abdominal aortic repair. She has been weaned off  of atenolol but is having some palpitations and PACs. She has the diagnosis of giant cell arteritis which is confirmed by temporal artery biopsy as well as biopsy from the aorta post surgical. She has been followed by Dr. Annye Asa with rheumatology. She's currently not on steroids.  She is planning and getting bilateral cataract surgery in the near future with intraocular lens implant to correct her astigmatism.  [name] returns today for follow-up. She denies any chest pain or worsening shortness of breath. She is having some left shoulder pain which is arthritic in nature. She is scheduled to see Dr. Ysidro Evert back at [facility] in April for follow-up of her aneurysm repairs. I suspect that we will follow her forword for stability of those repairs based on his recommendations. She recently had laboratory work including a lipid profile which showed total cholesterol of 161 and LDL of 86.  PMHx:  Past Medical History  Diagnosis Date  . Hypercholesteremia   . Temporal arteritis     giant cell arteritis  . Abdominal aneurysm   . Abdominal aortic aneurysm     5cm & ascending aortic aneursym 4.5cm  . Arthritis   . Hypertension   . Tobacco abuse   . Carotid artery disease     mild - carotid doppler 05/2010: mild amount of fibrous plaque in bilat ICAs, occlusive disease of L vertebral   . History of nuclear stress test 05/2010    bruce myoview; no evidence of ischemia, poor exercise  tolerance    Past Surgical History  Procedure Laterality Date  . Wrist surgery  2008  . Hemorroidectomy    . Breast surgery      biopsyx3  . Abdominal aortic aneurysm repair  02/22/2012    [facility] - supracoronary ascending aortic & hemi-arch replacement for a 6cm ascending aortic & prox arch aneursym  . Temporal artery biopsy / ligation  2006  . Transthoracic echocardiogram  10/07/2010    EF=50-55%, LV borderline dilated; mild mitral annular calcif; mild TR;     FAMHx:  Family History  Problem Relation Age of  Onset  . COPD Mother   . Cancer Father   . Heart disease Sister   . Hyperlipidemia Sister     x3  . Hypertension Sister   . Heart disease Brother     hyperlipidemia  . Stroke Maternal Grandfather   . Kidney disease Maternal Grandfather   . Diabetes Maternal Grandmother   . Stroke Paternal Grandmother   . Heart attack Paternal Grandfather   . Parkinson's disease Sister     also diabetes    SOCHx:   reports that she has been smoking Cigarettes.  She has a 20 pack-year smoking history. She has never used smokeless tobacco. She reports that she does not drink alcohol or use illicit drugs.  ALLERGIES:  Allergies  Allergen Reactions  . Septra [Sulfamethoxazole-Trimethoprim]   . Sulfa Antibiotics Rash    ROS: A comprehensive review of systems was negative except for: Musculoskeletal: positive for arthralgias and neck pain  HOME MEDS: Current Outpatient Prescriptions  Medication Sig Dispense Refill  . acetaminophen (TYLENOL) 500 MG tablet Take 500 mg by mouth every 6 (six) hours as needed. For pain    . aspirin 81 MG tablet Take 81 mg by mouth daily.    Marland Kitchen atenolol (TENORMIN) 25 MG tablet Take 1 tablet (25 mg total) by mouth daily. 90 tablet 3  . Cyanocobalamin (VITAMIN B-12) 2500 MCG SUBL Place under the tongue daily.    . DiphenhydrAMINE HCl, Sleep, (UNISOM SLEEPGELS) 50 MG CAPS Take 50 mg by mouth at bedtime.    . IBUPROFEN PO Take by mouth as needed.    . simvastatin (ZOCOR) 20 MG tablet Take 0.5 tablets (10 mg total) by mouth at bedtime. 45 tablet 3   No current facility-administered medications for this visit.    LABS/IMAGING: No results found for this or any previous visit (from the past 48 hour(s)). No results found.  VITALS: BP 100/70 mmHg  Pulse 73  Ht 5' 1.5" (1.562 m)  Wt 173 lb 6.4 oz (78.654 kg)  BMI 32.24 kg/m2  EXAM: General appearance: alert and no distress Neck: no carotid bruit and no JVD Lungs: clear to auscultation bilaterally Heart: regular  rate and rhythm, S1, S2 normal, no murmur, click, rub or gallop Abdomen: soft, non-tender; bowel sounds normal; no masses,  no organomegaly Extremities: extremities normal, atraumatic, no cyanosis or edema Pulses: 2+ and symmetric Skin: Skin color, texture, turgor normal. No rashes or lesions Neurologic: Grossly normal Psych:Mood, affect normal  EKG: Sinus rhythm with short PR interval, PACs/PVC's at 73  ASSESSMENT: 1. History of thoracic abdominal aneurysm status post rectoabdominal aortic aneurysm repair by Dr. Ysidro Evert at [facility] 2. Tobacco dependence 3. Mild carotid artery disease bilaterally 4. Obesity - ongoing weight loss 5. Dyslipidemia - well, if not over-controlled 6. Giant cell arteritis 7. PACs/PVC's  PLAN: 1.   Ms. Fontanilla is doing well and seems to recovered from her surgery. She did  have cataract surgery which required some correction however has had restored vision. She denies any worsening shortness of breath or chest pain. She is complaining of some left shoulder pain and left neck pain which may be arthritic in nature. I offered a referral to orthopedics but she declined. I would recommend continuing current medications and noting her cholesterol is at goal, staying on her dose of simvastatin. She will plan to see her thoracic surgeon in April and I suspect we will follow her aneurysm repairs going forward. Plan to see her back annually or sooner as necessary.  Pixie Casino, MD, [facility] Attending Cardiologist [facility]  Aliya Sol C 11/24/2013, 10:04 AM

## 2013-12-18 ENCOUNTER — Encounter (HOSPITAL_COMMUNITY): Payer: Self-pay | Admitting: Internal Medicine

## 2014-01-13 DIAGNOSIS — Z961 Presence of intraocular lens: Secondary | ICD-10-CM | POA: Diagnosis not present

## 2014-03-19 DIAGNOSIS — L820 Inflamed seborrheic keratosis: Secondary | ICD-10-CM | POA: Diagnosis not present

## 2014-03-19 DIAGNOSIS — L821 Other seborrheic keratosis: Secondary | ICD-10-CM | POA: Diagnosis not present

## 2014-03-19 DIAGNOSIS — D1801 Hemangioma of skin and subcutaneous tissue: Secondary | ICD-10-CM | POA: Diagnosis not present

## 2014-03-19 DIAGNOSIS — D225 Melanocytic nevi of trunk: Secondary | ICD-10-CM | POA: Diagnosis not present

## 2014-04-08 DIAGNOSIS — Z9889 Other specified postprocedural states: Secondary | ICD-10-CM | POA: Diagnosis not present

## 2014-04-08 DIAGNOSIS — I716 Thoracoabdominal aortic aneurysm, without rupture: Secondary | ICD-10-CM | POA: Diagnosis not present

## 2014-04-08 DIAGNOSIS — I712 Thoracic aortic aneurysm, without rupture: Secondary | ICD-10-CM | POA: Diagnosis not present

## 2014-04-20 DIAGNOSIS — M316 Other giant cell arteritis: Secondary | ICD-10-CM | POA: Diagnosis not present

## 2014-07-24 ENCOUNTER — Other Ambulatory Visit: Payer: Self-pay | Admitting: Internal Medicine

## 2014-07-24 NOTE — Telephone Encounter (Signed)
REFILL 

## 2014-10-28 DIAGNOSIS — Z23 Encounter for immunization: Secondary | ICD-10-CM | POA: Diagnosis not present

## 2014-11-18 ENCOUNTER — Other Ambulatory Visit: Payer: Self-pay | Admitting: Internal Medicine

## 2014-11-24 ENCOUNTER — Ambulatory Visit (INDEPENDENT_AMBULATORY_CARE_PROVIDER_SITE_OTHER): Payer: Medicare Other | Admitting: Internal Medicine

## 2014-11-24 ENCOUNTER — Encounter: Payer: Self-pay | Admitting: Internal Medicine

## 2014-11-24 VITALS — BP 144/70 | HR 70 | Ht 61.5 in | Wt 188.1 lb

## 2014-11-24 DIAGNOSIS — F17200 Nicotine dependence, unspecified, uncomplicated: Secondary | ICD-10-CM

## 2014-11-24 DIAGNOSIS — M316 Other giant cell arteritis: Secondary | ICD-10-CM

## 2014-11-24 DIAGNOSIS — I209 Angina pectoris, unspecified: Secondary | ICD-10-CM | POA: Diagnosis not present

## 2014-11-24 DIAGNOSIS — I7121 Aneurysm of the ascending aorta, without rupture: Secondary | ICD-10-CM

## 2014-11-24 DIAGNOSIS — E785 Hyperlipidemia, unspecified: Secondary | ICD-10-CM

## 2014-11-24 DIAGNOSIS — F419 Anxiety disorder, unspecified: Secondary | ICD-10-CM | POA: Diagnosis not present

## 2014-11-24 DIAGNOSIS — I7123 Aneurysm of the descending thoracic aorta, without rupture: Secondary | ICD-10-CM

## 2014-11-24 DIAGNOSIS — I712 Thoracic aortic aneurysm, without rupture: Secondary | ICD-10-CM

## 2014-11-24 MED ORDER — BUPROPION HCL ER (XL) 150 MG PO TB24: ORAL_TABLET | ORAL | Status: DC

## 2014-11-24 MED ORDER — ISOSORBIDE MONONITRATE ER 30 MG PO TB24: 30.0000 mg | ORAL_TABLET | Freq: Every day | ORAL | Status: DC

## 2014-11-24 MED ORDER — BUPROPION HCL ER (SR) 150 MG PO TB12: ORAL_TABLET | ORAL | Status: DC

## 2014-11-24 NOTE — Patient Instructions (Signed)
Your physician has recommended you make the following change in your medication..,.  1. START isosorbide mononitrate 30mg  once daily   >> take for 2 weeks - if symptoms are no better, then stop.Marland Kitchen if symptoms better, you can continue 2. START wellbutrin - do not start this at same time as isosorbide  Your physician recommends that you schedule a follow-up appointment in: 3 months with Dr. Debara Pickett

## 2014-11-24 NOTE — Progress Notes (Signed)
OFFICE NOTE  Chief Complaint:  Chest tightness  Primary Care Physician: Glo Herring., MD  HPI:  Elizabeth Meza  is a 69 year old female with history of temporal arteritis in the past; however, never saw a rheumatologist for that. She also has an abdominal aortic aneurysm measuring up to 5 cm and an ascending aortic aneurysm that measured 4.6 cm on echo. She is a smoker and had been working on smoking cessation, and she quit for a short period of time, however, restarted. She is on Wellbutrin for that. I had referred her to see Dr. Trudie Reed in [facility]; however, she never went to that appointment. Her EF is between 50% to 55% with mild diastolic dysfunction.  Her CT scan demonstrated enlargement of her aneurysms and I referred her to Dr. Roxan Hockey. He evaluated her studies and felt that her repair would be too complex and recommended that she be referred to [facility] to see Dr. Mali Hughes.  She ultimately underwent breath abdominal aneurysm repair by Dr. Ysidro Evert with a long hospitalization, but was discharged returns in followup today. Her surgery was deemed successful, however she still has some residual effects including some weakness in the right leg, back pain, and continued fatigue. Dr. Ysidro Evert recommended reducing her atenolol with a plan to try to wean that off. He was taking this medication for her aneurysms and not hypertension. Unfortunately she continues to smoke. She is also complaining of loss of appetite, weight loss and some constipation.  Finally she had recent laboratory work which shows a well-controlled lipid profile. Her LDL particle number is around 800 with an LDL C. of 50. Renal function and the rest of her metabolic profile were within normal limits.  Elizabeth Meza returns for followup again today. I recently received the data from [facility] including a CT scan which shows stability of her rectal abdominal aortic repair. She has been weaned off of  atenolol but is having some palpitations and PACs. She has the diagnosis of giant cell arteritis which is confirmed by temporal artery biopsy as well as biopsy from the aorta post surgical. She has been followed by Dr. Annye Asa with rheumatology. She's currently not on steroids.  She is planning and getting bilateral cataract surgery in the near future with intraocular lens implant to correct her astigmatism.  Elizabeth Meza returns today for follow-up. She denies any chest pain or worsening shortness of breath. She is having some left shoulder pain which is arthritic in nature. She is scheduled to see Dr. Ysidro Evert back at [facility] in April for follow-up of her aneurysm repairs. I suspect that we will follow her forword for stability of those repairs based on his recommendations. She recently had laboratory work including a lipid profile which showed total cholesterol of 161 and LDL of 86.  I had the pleasure of seeing Elizabeth Meza back in the office today for follow-up. She is complaining of some chest tightness. She says this is been chronic and seems to be on a daily basis ever since her surgery. Interestingly, however she reports that the symptoms get somewhat worse with exertion and are relieved by rest. She effectively doing certain activities because of the chest discomfort. Preoperatively she had cardiac catheterization which showed no obstructive coronary disease performed by myself in 2013.  PMHx:  Past Medical History  Diagnosis Date  . Hypercholesteremia   . Temporal arteritis ([place])  giant cell arteritis  . Abdominal aneurysm ([place])   . Abdominal aortic aneurysm (HCC)     5cm & ascending aortic aneursym 4.5cm  . Arthritis   . Hypertension   . Tobacco abuse   . Carotid artery disease (HCC)     mild - carotid doppler 05/2010: mild amount of fibrous plaque in bilat ICAs, occlusive disease of L vertebral   . History of nuclear stress test 05/2010    bruce myoview; no evidence of ischemia,  poor exercise tolerance    Past Surgical History  Procedure Laterality Date  . Wrist surgery  2008  . Hemorroidectomy    . Breast surgery      biopsyx3  . Abdominal aortic aneurysm repair  02/22/2012    [facility] - supracoronary ascending aortic & hemi-arch replacement for a 6cm ascending aortic & prox arch aneursym  . Temporal artery biopsy / ligation  2006  . Transthoracic echocardiogram  10/07/2010    EF=50-55%, LV borderline dilated; mild mitral annular calcif; mild TR;   . Left heart catheterization with coronary angiogram N/A 11/14/2011    Procedure: LEFT HEART CATHETERIZATION WITH CORONARY ANGIOGRAM;  Surgeon: Pixie Casino, MD;  Location: [facility] CATH LAB;  Service: Cardiovascular;  Laterality: N/A;    FAMHx:  Family History  Problem Relation Age of Onset  . COPD Mother   . Cancer Father   . Heart disease Sister   . Hyperlipidemia Sister     x3  . Hypertension Sister   . Heart disease Brother     hyperlipidemia  . Stroke Maternal Grandfather   . Kidney disease Maternal Grandfather   . Diabetes Maternal Grandmother   . Stroke Paternal Grandmother   . Heart attack Paternal Grandfather   . Parkinson's disease Sister     also diabetes    SOCHx:   reports that she has been smoking Cigarettes.  She has a 20 pack-year smoking history. She has never used smokeless tobacco. She reports that she does not drink alcohol or use illicit drugs.  ALLERGIES:  Allergies  Allergen Reactions  . Septra [Sulfamethoxazole-Trimethoprim]   . Sulfa Antibiotics Rash    ROS: A comprehensive review of systems was negative except for: Cardiovascular: positive for chest pressure/discomfort  HOME MEDS: Current Outpatient Prescriptions  Medication Sig Dispense Refill  . acetaminophen (TYLENOL) 500 MG tablet Take 500 mg by mouth every 6 (six) hours as needed. For pain    . aspirin 81 MG tablet Take 81 mg by mouth daily.    Marland Kitchen atenolol (TENORMIN) 50 MG tablet Take 1 tablet (50 mg total) by mouth  daily. NEED OV. 90 tablet 0  . Cyanocobalamin (VITAMIN B-12) 2500 MCG SUBL Place under the tongue daily.    . DiphenhydrAMINE HCl, Sleep, (UNISOM SLEEPGELS) 50 MG CAPS Take 50 mg by mouth at bedtime.    . IBUPROFEN PO Take by mouth as needed.    . simvastatin (ZOCOR) 20 MG tablet TAKE 1/2 TABLET AT BEDTIME 90 tablet 0  . buPROPion (WELLBUTRIN XL) 150 MG 24 hr tablet Take 1 tablet by mouth once daily for 3 days then increase to 1 tablet twice daily. 60 tablet 5  . isosorbide mononitrate (IMDUR) 30 MG 24 hr tablet Take 1 tablet (30 mg total) by mouth daily. 30 tablet 5   No current facility-administered medications for this visit.    LABS/IMAGING: No results found for this or any previous visit (from the past 48 hour(s)). No results found.  VITALS: BP 144/70 mmHg  Pulse 70  Ht 5' 1.5" (1.562 m)  Wt 188 lb 1.6 oz (85.322 kg)  BMI 34.97 kg/m2  EXAM: General appearance: alert and no distress Neck: no carotid bruit and no JVD Lungs: clear to auscultation bilaterally Heart: regular rate and rhythm, S1, S2 normal, no murmur, click, rub or gallop Abdomen: soft, non-tender; bowel sounds normal; no masses,  no organomegaly Extremities: extremities normal, atraumatic, no cyanosis or edema Pulses: 2+ and symmetric Skin: Skin color, texture, turgor normal. No rashes or lesions Neurologic: Grossly normal Psych:Mood, affect normal  EKG: Sinus rhythm with marked sinus arrhythmia, ST and T-wave abnormalities anteriorly at 70 (unchanged from prior EKG)  ASSESSMENT: 1. Persistent chronic chest tightness 2. History of thoracic abdominal aneurysm status post aortic aneurysm repair by Dr. Ysidro Evert at [facility] (2014) 3. Tobacco dependence 4. Mild carotid artery disease bilaterally 5. Obesity - ongoing weight loss 6. Dyslipidemia - well, if not over-controlled 7. Giant cell arteritis 8. PACs/PVC's  PLAN: 1.   Elizabeth Meza is describing persistent, chronic chest tightness which may be slightly worse. She  had no significant obstructive coronary disease by cath in 2013. I like to start with medical therapy and would recommend starting isosorbide to see if this improves her symptoms. If not she may need additional stress testing or cardiac catheterization. She is interested in smoking cessation again. In the past she has done well with Wellbutrin, however restarted smoking due to stressors in her life. Plan to restart Wellbutrin 150 mg daily for 3 days then increase to 150 mg twice daily of the sustained release.  Follow-up with me in 3 months.  Pixie Casino, MD, [facility] Attending Cardiologist [facility]  Nadean Corwin Hilty 11/24/2014, 1:48 PM

## 2014-12-08 ENCOUNTER — Telehealth: Payer: Self-pay | Admitting: Internal Medicine

## 2014-12-08 DIAGNOSIS — I10 Essential (primary) hypertension: Secondary | ICD-10-CM | POA: Diagnosis not present

## 2014-12-08 DIAGNOSIS — T50905S Adverse effect of unspecified drugs, medicaments and biological substances, sequela: Secondary | ICD-10-CM | POA: Diagnosis not present

## 2014-12-08 DIAGNOSIS — Z1389 Encounter for screening for other disorder: Secondary | ICD-10-CM | POA: Diagnosis not present

## 2014-12-08 DIAGNOSIS — Z6833 Body mass index (BMI) 33.0-33.9, adult: Secondary | ICD-10-CM | POA: Diagnosis not present

## 2014-12-08 DIAGNOSIS — I209 Angina pectoris, unspecified: Secondary | ICD-10-CM | POA: Diagnosis not present

## 2014-12-08 NOTE — Telephone Encounter (Signed)
Elizabeth Meza is calling to let Dr. Debara Pickett know that the medication(Isosorbide mono nitra 30mg  )  he prescribed 2wks is working .Marland Kitchen    Thanks

## 2014-12-08 NOTE — Telephone Encounter (Signed)
Called pt, confirmed medication working as intended. Noted if any new concerns to call.

## 2014-12-09 ENCOUNTER — Other Ambulatory Visit (HOSPITAL_COMMUNITY): Payer: Self-pay | Admitting: Internal Medicine

## 2014-12-09 DIAGNOSIS — Z139 Encounter for screening, unspecified: Secondary | ICD-10-CM

## 2014-12-09 DIAGNOSIS — Z1231 Encounter for screening mammogram for malignant neoplasm of breast: Secondary | ICD-10-CM

## 2014-12-09 DIAGNOSIS — Z780 Asymptomatic menopausal state: Secondary | ICD-10-CM

## 2014-12-10 ENCOUNTER — Other Ambulatory Visit: Payer: Self-pay | Admitting: Internal Medicine

## 2014-12-10 NOTE — Telephone Encounter (Signed)
REFILL 

## 2014-12-14 ENCOUNTER — Other Ambulatory Visit (HOSPITAL_COMMUNITY): Payer: Medicare Other

## 2014-12-14 ENCOUNTER — Ambulatory Visit (HOSPITAL_COMMUNITY): Payer: Medicare Other

## 2014-12-16 ENCOUNTER — Ambulatory Visit (HOSPITAL_COMMUNITY)
Admission: RE | Admit: 2014-12-16 | Discharge: 2014-12-16 | Disposition: A | Discharge status: Home / Self Care | Payer: Medicare Other | Source: Ambulatory Visit | Attending: Internal Medicine | Admitting: Internal Medicine

## 2014-12-16 ENCOUNTER — Ambulatory Visit (HOSPITAL_COMMUNITY): Payer: Medicare Other

## 2014-12-16 DIAGNOSIS — Z780 Asymptomatic menopausal state: Secondary | ICD-10-CM | POA: Insufficient documentation

## 2014-12-16 DIAGNOSIS — Z1231 Encounter for screening mammogram for malignant neoplasm of breast: Secondary | ICD-10-CM | POA: Insufficient documentation

## 2014-12-16 DIAGNOSIS — M8589 Other specified disorders of bone density and structure, multiple sites: Secondary | ICD-10-CM | POA: Diagnosis not present

## 2014-12-21 ENCOUNTER — Other Ambulatory Visit: Payer: Self-pay | Admitting: Internal Medicine

## 2014-12-21 DIAGNOSIS — R928 Other abnormal and inconclusive findings on diagnostic imaging of breast: Secondary | ICD-10-CM

## 2015-01-06 ENCOUNTER — Telehealth: Payer: Self-pay

## 2015-01-06 NOTE — Telephone Encounter (Addendum)
Gastroenterology Pre-Procedure Review  Request Date:01/06/2015 Requesting Physician: Dr. Gerarda Fraction  PATIENT REVIEW QUESTIONS: The patient responded to the following health history questions as indicated:    This will be pt's first colonoscopy  ( this is Susan's mom) She is requesting a Prepopik prep  1. Diabetes Melitis: no 2. Joint replacements in the past 12 months: no 3. Major health problems in the past 3 months: no 4. Has an artificial valve or MVP: no 5. Has a defibrillator: no 6. Has been advised in past to take antibiotics in advance of a procedure like teeth cleaning: no 7. Family history of colon cancer: no  8. Alcohol Use: no 9. History of sleep apnea: no     MEDICATIONS & ALLERGIES:    Patient reports the following regarding taking any blood thinners:   Plavix? no Aspirin? YES Coumadin? no  Patient confirms/reports the following medications:  Current Outpatient Prescriptions  Medication Sig Dispense Refill  . acetaminophen (TYLENOL) 500 MG tablet Take 500 mg by mouth every 6 (six) hours as needed. For pain    . aspirin 81 MG tablet Take 81 mg by mouth daily.    Marland Kitchen atenolol (TENORMIN) 50 MG tablet TAKE ONE (1) TABLET BY MOUTH EVERY DAY 90 tablet 0  . Cyanocobalamin (VITAMIN B-12) 2500 MCG SUBL Place under the tongue daily.    . DiphenhydrAMINE HCl, Sleep, (UNISOM SLEEPGELS) 50 MG CAPS Take 50 mg by mouth at bedtime.    . IBUPROFEN PO Take by mouth as needed.    . isosorbide mononitrate (IMDUR) 30 MG 24 hr tablet Take 1 tablet (30 mg total) by mouth daily. 30 tablet 5  . predniSONE (DELTASONE) 5 MG tablet Take 5 mg by mouth daily with breakfast.    . simvastatin (ZOCOR) 20 MG tablet TAKE 1/2 TABLET AT BEDTIME 90 tablet 0   No current facility-administered medications for this visit.    Patient confirms/reports the following allergies:  Allergies  Allergen Reactions  . Septra [Sulfamethoxazole-Trimethoprim]   . Sulfa Antibiotics Rash    No orders of the defined  types were placed in this encounter.    AUTHORIZATION INFORMATION Primary Insurance:   ID #:  Group #:  Pre-Cert / Auth required: Pre-Cert / Auth #:   Secondary Insurance:   ID #:   Group #:  Pre-Cert / Auth required:  Pre-Cert / Auth #:   SCHEDULE INFORMATION: Procedure has been scheduled as follows:  Date: 01/27/2015               Time:  9:30 AM Location: [facility] Short Stay  This Gastroenterology Pre-Precedure Review Form is being routed to the following provider(s): R. Garfield Cornea, MD

## 2015-01-06 NOTE — Telephone Encounter (Signed)
OK to schedule. OK with Prepopik split dose recommended, only if no problems currently with constipation. Make sure she follows mixing instructions and hydrates with 64 ounces of fluid during the morning before she starts the Prepopik.

## 2015-01-07 ENCOUNTER — Other Ambulatory Visit: Payer: Self-pay

## 2015-01-07 DIAGNOSIS — Z1211 Encounter for screening for malignant neoplasm of colon: Secondary | ICD-10-CM

## 2015-01-07 MED ORDER — SOD PICOSULFATE-MAG OX-CIT ACD 10-3.5-12 MG-GM-GM PO PACK: 1.0000 | PACK | ORAL | Status: DC

## 2015-01-07 NOTE — Telephone Encounter (Signed)
Rx sent to the pharmacy and instructions mailed to pt with the instructions to drink 64 ounces of water prior to starting the prep. Pt told me she does not have problems with constipation.

## 2015-01-19 ENCOUNTER — Ambulatory Visit (HOSPITAL_COMMUNITY)
Admission: RE | Admit: 2015-01-19 | Discharge: 2015-01-19 | Disposition: A | Discharge status: Home / Self Care | Payer: Medicare Other | Source: Ambulatory Visit | Attending: Internal Medicine | Admitting: Internal Medicine

## 2015-01-19 ENCOUNTER — Other Ambulatory Visit: Payer: Self-pay | Admitting: Internal Medicine

## 2015-01-19 DIAGNOSIS — R928 Other abnormal and inconclusive findings on diagnostic imaging of breast: Secondary | ICD-10-CM

## 2015-01-19 DIAGNOSIS — N63 Unspecified lump in breast: Secondary | ICD-10-CM | POA: Diagnosis not present

## 2015-01-19 DIAGNOSIS — N6320 Unspecified lump in the left breast, unspecified quadrant: Secondary | ICD-10-CM

## 2015-01-19 DIAGNOSIS — R922 Inconclusive mammogram: Secondary | ICD-10-CM | POA: Diagnosis present

## 2015-01-20 ENCOUNTER — Telehealth: Payer: Self-pay

## 2015-01-20 NOTE — Telephone Encounter (Signed)
Elizabeth Lukes, LPN           [name], Mom wants to cancel her colonoscopy for 1/18 with RMR. She has too many other doctor appointments at this time and I will try to get her to reschedule during the same time that I schedule mine in Sept 2017.      I cancelled the colonoscopy and [name] in Endo is aware.  Sending FYI to Neil Crouch, PA who signed off on this procedure.

## 2015-01-20 NOTE — Telephone Encounter (Signed)
Noted  

## 2015-01-27 ENCOUNTER — Ambulatory Visit (HOSPITAL_COMMUNITY): Admission: RE | Admit: 2015-01-27 | Payer: Medicare Other | Source: Ambulatory Visit | Admitting: Internal Medicine

## 2015-01-27 ENCOUNTER — Encounter (HOSPITAL_COMMUNITY): Admission: RE | Payer: Self-pay | Source: Ambulatory Visit

## 2015-01-27 SURGERY — COLONOSCOPY
Anesthesia: Moderate Sedation

## 2015-02-24 ENCOUNTER — Encounter: Payer: Self-pay | Admitting: Internal Medicine

## 2015-02-24 ENCOUNTER — Ambulatory Visit (INDEPENDENT_AMBULATORY_CARE_PROVIDER_SITE_OTHER): Payer: Medicare Other | Admitting: Internal Medicine

## 2015-02-24 VITALS — BP 162/82 | HR 66 | Ht 62.5 in | Wt 189.7 lb

## 2015-02-24 DIAGNOSIS — Z79899 Other long term (current) drug therapy: Secondary | ICD-10-CM

## 2015-02-24 DIAGNOSIS — E785 Hyperlipidemia, unspecified: Secondary | ICD-10-CM | POA: Diagnosis not present

## 2015-02-24 DIAGNOSIS — I493 Ventricular premature depolarization: Secondary | ICD-10-CM

## 2015-02-24 DIAGNOSIS — F329 Major depressive disorder, single episode, unspecified: Secondary | ICD-10-CM

## 2015-02-24 DIAGNOSIS — I712 Thoracic aortic aneurysm, without rupture: Secondary | ICD-10-CM | POA: Diagnosis not present

## 2015-02-24 DIAGNOSIS — F17200 Nicotine dependence, unspecified, uncomplicated: Secondary | ICD-10-CM

## 2015-02-24 DIAGNOSIS — F32A Depression, unspecified: Secondary | ICD-10-CM

## 2015-02-24 DIAGNOSIS — I7121 Aneurysm of the ascending aorta, without rupture: Secondary | ICD-10-CM

## 2015-02-24 MED ORDER — METOPROLOL SUCCINATE ER 25 MG PO TB24: 25.0000 mg | ORAL_TABLET | Freq: Every day | ORAL | Status: DC

## 2015-02-24 MED ORDER — IRBESARTAN 150 MG PO TABS: 150.0000 mg | ORAL_TABLET | Freq: Every day | ORAL | Status: DC

## 2015-02-24 MED ORDER — BUPROPION HCL ER (XL) 150 MG PO TB24: 150.0000 mg | ORAL_TABLET | Freq: Every day | ORAL | Status: DC

## 2015-02-24 NOTE — Patient Instructions (Signed)
Medication Instructions:   1. STOP isosorbide (imdur) 2. STOP atenolol  3. START metoprolol succinate (Toprol XL) 25mg  once daily 4. START irbesartan 150mg  once daily  5. START wellbutrin XR 150mg  once daily   Labwork:  Your physician recommends that you return for lab work in [place] - fasting (lipid, CMET)   Testing/Procedures:  none  Follow-Up:  Your physician recommends that you schedule a follow-up appointment in: 2 weeks with Erasmo Downer (pharmacist) for a BP check   Your physician recommends that you schedule a follow-up appointment in: 3 months with Dr.Hilty.    Any Other Special Instructions Will Be Listed Below (If Applicable).

## 2015-02-24 NOTE — Progress Notes (Signed)
OFFICE NOTE  Chief Complaint:  Follow-up chest tightness  Primary Care Physician: Glo Herring., MD  HPI:  Elizabeth Meza  is a 70 year old female with history of temporal arteritis in the past; however, never saw a rheumatologist for that. She also has an abdominal aortic aneurysm measuring up to 5 cm and an ascending aortic aneurysm that measured 4.6 cm on echo. She is a smoker and had been working on smoking cessation, and she quit for a short period of time, however, restarted. She is on Wellbutrin for that. I had referred her to see Dr. Trudie Reed in [facility] Rhe[facility]; however, she never went to that appointment. Her EF is between 50% to 55% with mild diastolic dysfunction.  Her CT scan demonstrated enlargement of her aneurysms and I referred her to Dr. Roxan Hockey. He evaluated her studies and felt that her repair would be too complex and recommended that she be referred to [facility] to see Dr. Mali Hughes.  She ultimately underwent breath abdominal aneurysm repair by Dr. Ysidro Evert with a long hospitalization, but was discharged returns in followup today. Her surgery was deemed successful, however she still has some residual effects including some weakness in the right leg, back pain, and continued fatigue. Dr. Ysidro Evert recommended reducing her atenolol with a plan to try to wean that off. He was taking this medication for her aneurysms and not hypertension. Unfortunately she continues to smoke. She is also complaining of loss of appetite, weight loss and some constipation.  Finally she had recent laboratory work which shows a well-controlled lipid profile. Her LDL particle number is around 800 with an LDL C. of 50. Renal function and the rest of her metabolic profile were within normal limits.  Elizabeth Meza returns for followup again today. I recently received the data from [facility] including a CT scan which shows stability of her rectal abdominal aortic repair. She has been  weaned off of atenolol but is having some palpitations and PACs. She has the diagnosis of giant cell arteritis which is confirmed by temporal artery biopsy as well as biopsy from the aorta post surgical. She has been followed by Dr. Annye Asa with rheumatology. She's currently not on steroids.  She is planning and getting bilateral cataract surgery in the near future with intraocular lens implant to correct her astigmatism.  Elizabeth Meza returns today for follow-up. She denies any chest pain or worsening shortness of breath. She is having some left shoulder pain which is arthritic in nature. She is scheduled to see Dr. Ysidro Evert back at [facility] in April for follow-up of her aneurysm repairs. I suspect that we will follow her forword for stability of those repairs based on his recommendations. She recently had laboratory work including a lipid profile which showed total cholesterol of 161 and LDL of 86.  I had the pleasure of seeing Elizabeth Meza back in the office today for follow-up. She is complaining of some chest tightness. She says this is been chronic and seems to be on a daily basis ever since her surgery. Interestingly, however she reports that the symptoms get somewhat worse with exertion and are relieved by rest. She effectively doing certain activities because of the chest discomfort. Preoperatively she had cardiac catheterization which showed no obstructive coronary disease performed by myself in 2013.  Elizabeth Meza returns today for follow-up of her chest tightness. In the interim I gave her some isosorbide  to try for medical therapy. She says the medicine gave her headaches and did not improve her chest tightness. As it was mentioned she had no coronary artery disease by cath in 2013 prior to her aneurysm repair. It may be that there is another cause of her chest discomfort. She is however having frequent ventricular ectopy. She is on low-dose atenolol. I gave her Wellbutrin to use for smoking cessation  at her request however she said this made her nauseated. She says that she may do better on the once daily ask our version. She wants to try that. Blood pressure however is not been well controlled and currently is 162/82.  PMHx:  Past Medical History  Diagnosis Date  . Hypercholesteremia   . Temporal arteritis (HCC)     giant cell arteritis  . Abdominal aneurysm ([place])   . Abdominal aortic aneurysm (HCC)     5cm & ascending aortic aneursym 4.5cm  . Arthritis   . Hypertension   . Tobacco abuse   . Carotid artery disease (HCC)     mild - carotid doppler 05/2010: mild amount of fibrous plaque in bilat ICAs, occlusive disease of L vertebral   . History of nuclear stress test 05/2010    bruce myoview; no evidence of ischemia, poor exercise tolerance    Past Surgical History  Procedure Laterality Date  . Wrist surgery  2008  . Hemorroidectomy    . Breast surgery      biopsyx3  . Abdominal aortic aneurysm repair  02/22/2012    [facility] - supracoronary ascending aortic & hemi-arch replacement for a 6cm ascending aortic & prox arch aneursym  . Temporal artery biopsy / ligation  2006  . Transthoracic echocardiogram  10/07/2010    EF=50-55%, LV borderline dilated; mild mitral annular calcif; mild TR;   . Left heart catheterization with coronary angiogram N/A 11/14/2011    Procedure: LEFT HEART CATHETERIZATION WITH CORONARY ANGIOGRAM;  Surgeon: Pixie Casino, MD;  Location: [facility] CATH LAB;  Service: Cardiovascular;  Laterality: N/A;    FAMHx:  Family History  Problem Relation Age of Onset  . COPD Mother   . Cancer Father   . Heart disease Sister   . Hyperlipidemia Sister     x3  . Hypertension Sister   . Heart disease Brother     hyperlipidemia  . Stroke Maternal Grandfather   . Kidney disease Maternal Grandfather   . Diabetes Maternal Grandmother   . Stroke Paternal Grandmother   . Heart attack Paternal Grandfather   . Parkinson's disease Sister     also diabetes    SOCHx:    reports that she has been smoking Cigarettes.  She has a 20 pack-year smoking history. She has never used smokeless tobacco. She reports that she does not drink alcohol or use illicit drugs.  ALLERGIES:  Allergies  Allergen Reactions  . Septra [Sulfamethoxazole-Trimethoprim]   . Sulfa Antibiotics Rash    ROS: A comprehensive review of systems was negative except for: Cardiovascular: positive for chest pressure/discomfort  HOME MEDS: Current Outpatient Prescriptions  Medication Sig Dispense Refill  . acetaminophen (TYLENOL) 500 MG tablet Take 500 mg by mouth every 6 (six) hours as needed. For pain    . aspirin EC 81 MG tablet Take 81 mg by mouth daily.    . Cyanocobalamin (VITAMIN B-12) 2500 MCG SUBL Place under the tongue daily.    . DiphenhydrAMINE HCl, Sleep, (UNISOM SLEEPGELS) 50 MG CAPS Take 50 mg by mouth at bedtime.    Marland Kitchen  ibuprofen (ADVIL,MOTRIN) 200 MG tablet Take 200 mg by mouth every 8 (eight) hours as needed for mild pain.    . predniSONE (DELTASONE) 5 MG tablet Take 5 mg by mouth daily with breakfast.    . simvastatin (ZOCOR) 20 MG tablet TAKE 1/2 TABLET AT BEDTIME 90 tablet 0  . buPROPion (WELLBUTRIN XL) 150 MG 24 hr tablet Take 1 tablet (150 mg total) by mouth daily. 30 tablet 6  . irbesartan (AVAPRO) 150 MG tablet Take 1 tablet (150 mg total) by mouth daily. 30 tablet 6  . metoprolol succinate (TOPROL-XL) 25 MG 24 hr tablet Take 1 tablet (25 mg total) by mouth daily. 30 tablet 6  . Sod Picosulfate-Mag Ox-Cit Acd 10-3.5-12 MG-GM-GM PACK Take 1 Container by mouth as directed. (Patient not taking: Reported on 02/24/2015) 1 each 0   No current facility-administered medications for this visit.    LABS/IMAGING: No results found for this or any previous visit (from the past 48 hour(s)). No results found.  VITALS: BP 162/82 mmHg  Pulse 66  Ht 5' 2.5" (1.588 m)  Wt 189 lb 11.2 oz (86.047 kg)  BMI 34.12 kg/m2  EXAM: Deferred  EKG: Sinus rhythm with marked sinus  arrhythmia, ST and T-wave abnormalities anteriorly at 66, PVCs  ASSESSMENT: 1. Improved chest tightness 2. History of thoracic abdominal aneurysm status post aortic aneurysm repair by Dr. Ysidro Evert at [facility] (2014) 3. Tobacco dependence 4. Mild carotid artery disease bilaterally 5. Obesity - ongoing weight loss 6. Dyslipidemia - well, if not over-controlled 7. Giant cell arteritis 8. PACs/PVC's  PLAN: 1.   Ms. Stasko does not report any improvement in her chest tightness related to isosorbide, which gave her headache. We'll discontinue that today. Overall though her chest tightness is improved somewhat. This may be related to smoking cessation. She decreased her smoking but has not stopped totally. She said that the Wellbutrin gave her nausea. She's also been struggling with some mood disorders well. She has some depression and could benefit from Wellbutrin XL which is less likely to cause nausea. Order that today. I like to switch her atenolol over to metoprolol for better suppression of PVCs. We'll also add irbesartan 150 mg daily for better blood pressure control. She'll need follow-up with Erasmo Downer in 1-2 weeks for recheck of her blood pressure and me in 3 months to monitor her smoking cessation and response with regards to depression on Wellbutrin.  Follow-up with me in 3 months.  Pixie Casino, MD, [facility] Attending Cardiologist [place] Hilty 02/24/2015, 1:00 PM

## 2015-03-02 DIAGNOSIS — Z79899 Other long term (current) drug therapy: Secondary | ICD-10-CM | POA: Diagnosis not present

## 2015-03-02 DIAGNOSIS — E785 Hyperlipidemia, unspecified: Secondary | ICD-10-CM | POA: Diagnosis not present

## 2015-03-03 ENCOUNTER — Other Ambulatory Visit: Payer: Self-pay | Admitting: *Deleted

## 2015-03-03 LAB — COMPREHENSIVE METABOLIC PANEL
A/G RATIO: 2.3 (ref 1.1–2.5)
ALBUMIN: 4.3 g/dL (ref 3.5–4.8)
ALK PHOS: 64 IU/L (ref 39–117)
ALT: 8 IU/L (ref 0–32)
AST: 12 IU/L (ref 0–40)
BUN / CREAT RATIO: 17 (ref 11–26)
BUN: 14 mg/dL (ref 8–27)
Bilirubin Total: 0.9 mg/dL (ref 0.0–1.2)
CO2: 22 mmol/L (ref 18–29)
Calcium: 9.3 mg/dL (ref 8.7–10.3)
Chloride: 107 mmol/L — ABNORMAL HIGH (ref 96–106)
Creatinine, Ser: 0.84 mg/dL (ref 0.57–1.00)
GFR calc Af Amer: 81 mL/min/{1.73_m2} (ref >59)
GFR, EST NON AFRICAN AMERICAN: 71 mL/min/{1.73_m2} (ref >59)
GLOBULIN, TOTAL: 1.9 g/dL (ref 1.5–4.5)
Glucose: 107 mg/dL — ABNORMAL HIGH (ref 65–99)
POTASSIUM: 4.4 mmol/L (ref 3.5–5.2)
SODIUM: 148 mmol/L — AB (ref 134–144)
Total Protein: 6.2 g/dL (ref 6.0–8.5)

## 2015-03-03 LAB — LIPID PANEL
CHOL/HDL RATIO: 3.2 ratio (ref 0.0–4.4)
CHOLESTEROL TOTAL: 202 mg/dL — AB (ref 100–199)
HDL: 63 mg/dL (ref >39)
LDL CALC: 101 mg/dL — AB (ref 0–99)
TRIGLYCERIDES: 192 mg/dL — AB (ref 0–149)
VLDL Cholesterol Cal: 38 mg/dL (ref 5–40)

## 2015-03-03 MED ORDER — SIMVASTATIN 20 MG PO TABS: 20.0000 mg | ORAL_TABLET | Freq: Every day | ORAL | Status: DC

## 2015-03-11 ENCOUNTER — Ambulatory Visit (INDEPENDENT_AMBULATORY_CARE_PROVIDER_SITE_OTHER): Payer: Medicare Other | Admitting: Pharmacist Clinician (PhC)/ Clinical Pharmacy Specialist

## 2015-03-11 VITALS — BP 134/62 | HR 72 | Ht 62.5 in | Wt 192.9 lb

## 2015-03-11 DIAGNOSIS — I10 Essential (primary) hypertension: Secondary | ICD-10-CM | POA: Diagnosis not present

## 2015-03-11 NOTE — Patient Instructions (Signed)
  Your blood pressure today is 134/62  (goal is to be < 150/90)  Check your blood pressure at home several days each week and keep record of the readings.  Take your BP meds as follows: no changes to medication  Bring all of your meds, your BP cuff and your record of home blood pressures to your next appointment.  Exercise as you're able, try to walk approximately 30 minutes per day.  Keep salt intake to a minimum, especially watch canned and prepared boxed foods.  Eat more fresh fruits and vegetables and fewer canned items.  Avoid eating in fast food restaurants.    HOW TO TAKE YOUR BLOOD PRESSURE: . Rest 5 minutes before taking your blood pressure. .  Don't smoke or drink caffeinated beverages for at least 30 minutes before. . Take your blood pressure before (not after) you eat. . Sit comfortably with your back supported and both feet on the floor (don't cross your legs). . Elevate your arm to heart level on a table or a desk. . Use the proper sized cuff. It should fit smoothly and snugly around your bare upper arm. There should be enough room to slip a fingertip under the cuff. The bottom edge of the cuff should be 1 inch above the crease of the elbow. . Ideally, take 3 measurements at one sitting and record the average.

## 2015-03-14 ENCOUNTER — Encounter: Payer: Self-pay | Admitting: Pharmacist Clinician (PhC)/ Clinical Pharmacy Specialist

## 2015-03-14 DIAGNOSIS — I10 Essential (primary) hypertension: Secondary | ICD-10-CM | POA: Insufficient documentation

## 2015-03-14 NOTE — Progress Notes (Signed)
03/14/2015 Diamone P Setter [birth year] [id number]   HPI:  Elizabeth Meza is a 70 y.o. female patient of Dr Debara Pickett, with a PMH below who presents today for hypertension clinic evaluation.  She saw Dr Debara Pickett about 2 weeks ago and had a blood pressure of 162/82.  He added irbesartan 150 mg.  Patient states she has never had blood pressure problems until she had to start taking prednisone daily for her temporal arteritis.  She started that about 3 months ago.    Cardiac Hx: temporal arteritis, abdominal aortic aneurysm, mild diastolic dysfunction, tobacco abuse  Family Hx: 1 sister with "heart disease", 1 brother with MI at 51, 1 daughter with hypertension, now 78  Social Hx: trying to quit smoking, down to 5-6 cigarettes per day, on buproprion; no alcohol  Diet: eats most meals at home, occasional canned soups, does use salt when cooking but not at table  Exercise: nothing specific  Home BP readings: checked on 4 different days (twice each day) all WNL, with the highest at 132/81.  Average 123/63  Current antihypertensive medications: irbesartan 150 mg qd, metoprolol succ 25 mg qd   Wt Readings from Last 3 Encounters:  03/14/15 192 lb 14.4 oz (87.499 kg)  02/24/15 189 lb 11.2 oz (86.047 kg)  11/24/14 188 lb 1.6 oz (85.322 kg)   BP Readings from Last 3 Encounters:  03/14/15 134/62  02/24/15 162/82  11/24/14 144/70   Pulse Readings from Last 3 Encounters:  03/14/15 72  02/24/15 66  11/24/14 70    Current Outpatient Prescriptions  Medication Sig Dispense Refill  . acetaminophen (TYLENOL) 500 MG tablet Take 500 mg by mouth every 6 (six) hours as needed. For pain    . aspirin EC 81 MG tablet Take 81 mg by mouth daily.    Marland Kitchen buPROPion (WELLBUTRIN XL) 150 MG 24 hr tablet Take 1 tablet (150 mg total) by mouth daily. 30 tablet 6  . Cyanocobalamin (VITAMIN B-12) 2500 MCG SUBL Place under the tongue daily.    . DiphenhydrAMINE HCl, Sleep, (UNISOM SLEEPGELS) 50 MG CAPS Take 50 mg by mouth at  bedtime.    Marland Kitchen ibuprofen (ADVIL,MOTRIN) 200 MG tablet Take 200 mg by mouth every 8 (eight) hours as needed for mild pain.    Marland Kitchen irbesartan (AVAPRO) 150 MG tablet Take 1 tablet (150 mg total) by mouth daily. 30 tablet 6  . metoprolol succinate (TOPROL-XL) 25 MG 24 hr tablet Take 1 tablet (25 mg total) by mouth daily. 30 tablet 6  . predniSONE (DELTASONE) 5 MG tablet Take 5 mg by mouth daily with breakfast.    . simvastatin (ZOCOR) 20 MG tablet Take 1 tablet (20 mg total) by mouth at bedtime. 90 tablet 3  . Sod Picosulfate-Mag Ox-Cit Acd 10-3.5-12 MG-GM-GM PACK Take 1 Container by mouth as directed. (Patient not taking: Reported on 02/24/2015) 1 each 0   No current facility-administered medications for this visit.    Allergies  Allergen Reactions  . Septra [Sulfamethoxazole-Trimethoprim]   . Sulfa Antibiotics Rash    Past Medical History  Diagnosis Date  . Hypercholesteremia   . Temporal arteritis (HCC)     giant cell arteritis  . Abdominal aneurysm ([place])   . Abdominal aortic aneurysm (HCC)     5cm & ascending aortic aneursym 4.5cm  . Arthritis   . Hypertension   . Tobacco abuse   . Carotid artery disease (HCC)     mild - carotid doppler 05/2010: mild amount of fibrous  plaque in bilat ICAs, occlusive disease of L vertebral   . History of nuclear stress test 05/2010    bruce myoview; no evidence of ischemia, poor exercise tolerance    Blood pressure 134/62, pulse 72, height 5' 2.5" (1.588 m), weight 192 lb 14.4 oz (87.499 kg).    Tommy Medal PharmD CPP [facility]

## 2015-03-14 NOTE — Assessment & Plan Note (Signed)
Elizabeth Meza's blood pressure is much improved with the addition of irbesartan 150 mg.  This hypertension could be related to the addition of prednisone, so will have to monitor her pressure should her other MD decide to discontinue prednisone.  She checked her home BP over 4 different days with all good results.  I will have her continue with her current regimen and take her home BP several times each week.  She should call if she notices a trend upward to > [id number] systolic, or (should the prednisone be discontinued) symptomatic hypotension.  Reviewed her tobacco use and encouraged her to keep at it.

## 2015-04-09 DIAGNOSIS — I716 Thoracoabdominal aortic aneurysm, without rupture: Secondary | ICD-10-CM | POA: Diagnosis not present

## 2015-04-09 DIAGNOSIS — Z9889 Other specified postprocedural states: Secondary | ICD-10-CM | POA: Diagnosis not present

## 2015-04-09 DIAGNOSIS — Z8679 Personal history of other diseases of the circulatory system: Secondary | ICD-10-CM | POA: Diagnosis not present

## 2015-04-09 DIAGNOSIS — Z7982 Long term (current) use of aspirin: Secondary | ICD-10-CM | POA: Diagnosis not present

## 2015-04-09 DIAGNOSIS — Z48812 Encounter for surgical aftercare following surgery on the circulatory system: Secondary | ICD-10-CM | POA: Diagnosis not present

## 2015-04-09 DIAGNOSIS — R0789 Other chest pain: Secondary | ICD-10-CM | POA: Diagnosis not present

## 2015-04-09 DIAGNOSIS — Z79899 Other long term (current) drug therapy: Secondary | ICD-10-CM | POA: Diagnosis not present

## 2015-05-25 ENCOUNTER — Ambulatory Visit: Payer: Medicare Other | Admitting: Internal Medicine

## 2015-06-10 ENCOUNTER — Ambulatory Visit (INDEPENDENT_AMBULATORY_CARE_PROVIDER_SITE_OTHER): Payer: Medicare Other | Admitting: Internal Medicine

## 2015-06-10 ENCOUNTER — Encounter: Payer: Self-pay | Admitting: Internal Medicine

## 2015-06-10 VITALS — BP 110/60 | HR 72 | Ht 61.0 in | Wt 193.0 lb

## 2015-06-10 DIAGNOSIS — F329 Major depressive disorder, single episode, unspecified: Secondary | ICD-10-CM

## 2015-06-10 DIAGNOSIS — R002 Palpitations: Secondary | ICD-10-CM

## 2015-06-10 DIAGNOSIS — Z9889 Other specified postprocedural states: Secondary | ICD-10-CM

## 2015-06-10 DIAGNOSIS — E785 Hyperlipidemia, unspecified: Secondary | ICD-10-CM

## 2015-06-10 DIAGNOSIS — F32A Depression, unspecified: Secondary | ICD-10-CM

## 2015-06-10 DIAGNOSIS — F17200 Nicotine dependence, unspecified, uncomplicated: Secondary | ICD-10-CM

## 2015-06-10 DIAGNOSIS — F419 Anxiety disorder, unspecified: Secondary | ICD-10-CM

## 2015-06-10 DIAGNOSIS — Z8679 Personal history of other diseases of the circulatory system: Secondary | ICD-10-CM

## 2015-06-10 NOTE — Patient Instructions (Signed)
To wean off Wellbutrin: take every other day for 1 week (4 times within this week) then stop the medication    Your physician wants you to follow-up in: 6 months with Dr. Debara Pickett. You will receive a reminder letter in the mail two months in advance. If you don't receive a letter, please call our office to schedule the follow-up appointment.

## 2015-06-10 NOTE — Progress Notes (Signed)
OFFICE NOTE  Chief Complaint:  Follow-up palpitations, smoking cessation  Primary Care Physician: Glo Herring., MD  HPI:  Elizabeth Meza  is a 70 year old female with history of temporal arteritis in the past; however, never saw a rheumatologist for that. She also has an abdominal aortic aneurysm measuring up to 5 cm and an ascending aortic aneurysm that measured 4.6 cm on echo. She is a smoker and had been working on smoking cessation, and she quit for a short period of time, however, restarted. She is on Wellbutrin for that. I had referred her to see Dr. Trudie Reed in [facility]; however, she never went to that appointment. Her EF is between 50% to 55% with mild diastolic dysfunction.  Her CT scan demonstrated enlargement of her aneurysms and I referred her to Dr. Roxan Hockey. He evaluated her studies and felt that her repair would be too complex and recommended that she be referred to [facility] to see Dr. Mali Hughes.  She ultimately underwent breath abdominal aneurysm repair by Dr. Ysidro Evert with a long hospitalization, but was discharged returns in followup today. Her surgery was deemed successful, however she still has some residual effects including some weakness in the right leg, back pain, and continued fatigue. Dr. Ysidro Evert recommended reducing her atenolol with a plan to try to wean that off. He was taking this medication for her aneurysms and not hypertension. Unfortunately she continues to smoke. She is also complaining of loss of appetite, weight loss and some constipation.  Finally she had recent laboratory work which shows a well-controlled lipid profile. Her LDL particle number is around 800 with an LDL C. of 50. Renal function and the rest of her metabolic profile were within normal limits.  Elizabeth Meza returns for followup again today. I recently received the data from [facility] including a CT scan which shows stability of her rectal abdominal aortic repair.  She has been weaned off of atenolol but is having some palpitations and PACs. She has the diagnosis of giant cell arteritis which is confirmed by temporal artery biopsy as well as biopsy from the aorta post surgical. She has been followed by Dr. Annye Asa with rheumatology. She's currently not on steroids.  She is planning and getting bilateral cataract surgery in the near future with intraocular lens implant to correct her astigmatism.  Elizabeth Meza returns today for follow-up. She denies any chest pain or worsening shortness of breath. She is having some left shoulder pain which is arthritic in nature. She is scheduled to see Dr. Ysidro Evert back at [facility] in April for follow-up of her aneurysm repairs. I suspect that we will follow her forword for stability of those repairs based on his recommendations. She recently had laboratory work including a lipid profile which showed total cholesterol of 161 and LDL of 86.  I had the pleasure of seeing Elizabeth Meza back in the office today for follow-up. She is complaining of some chest tightness. She says this is been chronic and seems to be on a daily basis ever since her surgery. Interestingly, however she reports that the symptoms get somewhat worse with exertion and are relieved by rest. She effectively doing certain activities because of the chest discomfort. Preoperatively she had cardiac catheterization which showed no obstructive coronary disease performed by myself in 2013.  Elizabeth Meza returns today for follow-up of her chest tightness. In the interim I gave her some  isosorbide to try for medical therapy. She says the medicine gave her headaches and did not improve her chest tightness. As it was mentioned she had no coronary artery disease by cath in 2013 prior to her aneurysm repair. It may be that there is another cause of her chest discomfort. She is however having frequent ventricular ectopy. She is on low-dose atenolol. I gave her Wellbutrin to use for  smoking cessation at her request however she said this made her nauseated. She says that she may do better on the once daily ask our version. She wants to try that. Blood pressure however is not been well controlled and currently is 162/82.  06/10/2015  Elizabeth Meza was seen back today in the office for follow-up. She reports some improvement in her palpitations with switching from atenolol to metoprolol. She has cut back on her smoking but not yet finished it. She is on Wellbutrin XL and was intolerant of short acting Wellbutrin. We talked about continuing this longer however, she says she wishes to wean off of it at this time. I advised her that she may be more likely to go back to smoking but she seemed to dismiss this.  PMHx:  Past Medical History  Diagnosis Date  . Hypercholesteremia   . Temporal arteritis (HCC)     giant cell arteritis  . Abdominal aneurysm ([place])   . Abdominal aortic aneurysm (HCC)     5cm & ascending aortic aneursym 4.5cm  . Arthritis   . Hypertension   . Tobacco abuse   . Carotid artery disease (HCC)     mild - carotid doppler 05/2010: mild amount of fibrous plaque in bilat ICAs, occlusive disease of L vertebral   . History of nuclear stress test 05/2010    bruce myoview; no evidence of ischemia, poor exercise tolerance    Past Surgical History  Procedure Laterality Date  . Wrist surgery  2008  . Hemorroidectomy    . Breast surgery      biopsyx3  . Abdominal aortic aneurysm repair  02/22/2012    [facility] - supracoronary ascending aortic & hemi-arch replacement for a 6cm ascending aortic & prox arch aneursym  . Temporal artery biopsy / ligation  2006  . Transthoracic echocardiogram  10/07/2010    EF=50-55%, LV borderline dilated; mild mitral annular calcif; mild TR;   . Left heart catheterization with coronary angiogram N/A 11/14/2011    Procedure: LEFT HEART CATHETERIZATION WITH CORONARY ANGIOGRAM;  Surgeon: Pixie Casino, MD;  Location: [facility] CATH LAB;  Service:  Cardiovascular;  Laterality: N/A;    FAMHx:  Family History  Problem Relation Age of Onset  . COPD Mother   . Cancer Father   . Heart disease Sister   . Hyperlipidemia Sister     x3  . Hypertension Sister   . Heart disease Brother     hyperlipidemia  . Stroke Maternal Grandfather   . Kidney disease Maternal Grandfather   . Diabetes Maternal Grandmother   . Stroke Paternal Grandmother   . Heart attack Paternal Grandfather   . Parkinson's disease Sister     also diabetes    SOCHx:   reports that she has been smoking Cigarettes.  She has a 20 pack-year smoking history. She has never used smokeless tobacco. She reports that she does not drink alcohol or use illicit drugs.  ALLERGIES:  Allergies  Allergen Reactions  . Septra [Sulfamethoxazole-Trimethoprim]   . Sulfa Antibiotics Rash    ROS: Pertinent items noted in HPI  and remainder of comprehensive ROS otherwise negative.  HOME MEDS: Current Outpatient Prescriptions  Medication Sig Dispense Refill  . acetaminophen (TYLENOL) 500 MG tablet Take 500 mg by mouth every 6 (six) hours as needed. For pain    . aspirin EC 81 MG tablet Take 81 mg by mouth daily.    Marland Kitchen buPROPion (WELLBUTRIN XL) 150 MG 24 hr tablet Take 1 tablet (150 mg total) by mouth daily. 30 tablet 6  . Cyanocobalamin (VITAMIN B-12) 2500 MCG SUBL Place under the tongue daily.    . DiphenhydrAMINE HCl, Sleep, (UNISOM SLEEPGELS) 50 MG CAPS Take 50 mg by mouth at bedtime.    Marland Kitchen ibuprofen (ADVIL,MOTRIN) 200 MG tablet Take 200 mg by mouth every 8 (eight) hours as needed for mild pain.    Marland Kitchen irbesartan (AVAPRO) 150 MG tablet Take 1 tablet (150 mg total) by mouth daily. 30 tablet 6  . metoprolol succinate (TOPROL-XL) 25 MG 24 hr tablet Take 1 tablet (25 mg total) by mouth daily. 30 tablet 6  . simvastatin (ZOCOR) 20 MG tablet Take 1 tablet (20 mg total) by mouth at bedtime. 90 tablet 3   No current facility-administered medications for this visit.    LABS/IMAGING: No  results found for this or any previous visit (from the past 48 hour(s)). No results found.  VITALS: BP 110/60 mmHg  Pulse 72  Ht 5\' 1"  (1.549 m)  Wt 193 lb (87.544 kg)  BMI 36.49 kg/m2  EXAM: Deferred  EKG: Deferred  ASSESSMENT: 1. History of thoracic abdominal aneurysm status post aortic aneurysm repair by Dr. Ysidro Evert at [facility] (2014) 2. Tobacco dependence 3. Mild carotid artery disease bilaterally 4. Obesity - ongoing weight loss 5. Dyslipidemia - well, if not over-controlled 6. Giant cell arteritis 7. PACs/PVC's - improved on metoprolol  PLAN: 1.   Ms. Kerwick reports an improvement in her PACs and PVCs on metoprolol. She denies any further chest pain. She reported some improvement in smoking cessation on Wellbutrin however wants to come off this medicine even though she has not yet quit smoking. Most of her smoking since be related to stress and recently she's had several episodes including losing her power last night which is not yet on. Nevertheless, she wishes to wean off of the medicine and I've advised her to take the Wellbutrin every other day for 1 week (4 doses) then discontinue the medicine. This would decrease her risk of possible withdrawal.   Follow-up with me in 6 months.  Pixie Casino, MD, [facility] Attending Cardiologist [place] 06/10/2015, 1:03 PM

## 2015-09-20 ENCOUNTER — Other Ambulatory Visit: Payer: Self-pay | Admitting: Internal Medicine

## 2015-09-20 NOTE — Telephone Encounter (Signed)
REFILL 

## 2015-10-19 DIAGNOSIS — Z23 Encounter for immunization: Secondary | ICD-10-CM | POA: Diagnosis not present

## 2015-11-09 ENCOUNTER — Other Ambulatory Visit: Payer: Self-pay | Admitting: Internal Medicine

## 2015-12-20 ENCOUNTER — Other Ambulatory Visit: Payer: Self-pay | Admitting: Internal Medicine

## 2015-12-20 NOTE — Telephone Encounter (Signed)
REFILL 

## 2016-01-06 ENCOUNTER — Other Ambulatory Visit: Payer: Self-pay | Admitting: *Deleted

## 2016-01-06 MED ORDER — IRBESARTAN 150 MG PO TABS: 150.0000 mg | ORAL_TABLET | Freq: Every day | ORAL | 1 refills | Status: DC

## 2016-01-20 ENCOUNTER — Other Ambulatory Visit: Payer: Self-pay | Admitting: Internal Medicine

## 2016-01-20 NOTE — Telephone Encounter (Signed)
Rx has been sent to the pharmacy electronically. ° °

## 2016-02-21 ENCOUNTER — Other Ambulatory Visit: Payer: Self-pay | Admitting: Internal Medicine

## 2016-02-22 ENCOUNTER — Telehealth: Payer: Self-pay | Admitting: Internal Medicine

## 2016-02-22 MED ORDER — METOPROLOL SUCCINATE ER 25 MG PO TB24: 25.0000 mg | ORAL_TABLET | Freq: Every day | ORAL | 0 refills | Status: DC

## 2016-02-22 NOTE — Telephone Encounter (Signed)
Rx(s) sent to pharmacy electronically.  

## 2016-02-22 NOTE — Telephone Encounter (Signed)
New Message  Pt is requesting call back from nurse  *STAT* If patient is at the pharmacy, call can be transferred to refill team.   1. Which medications need to be refilled? (please list name of each medication and dose if known)   metoprolol succinate (TOPROL-XL) 25 MG 24 hr tablet   2. Which pharmacy/location (including street and city if local pharmacy) is medication to be sent to? [place]  3. Do they need a 30 day or 90 day supply? 30 day

## 2016-02-22 NOTE — Telephone Encounter (Signed)
Notified pt that she is due for annual appointment. Appt scheduled informed pt that she must keep appt for future refills. Refill sent to make it to appt.

## 2016-03-16 ENCOUNTER — Encounter: Payer: Self-pay | Admitting: Internal Medicine

## 2016-03-16 ENCOUNTER — Ambulatory Visit (INDEPENDENT_AMBULATORY_CARE_PROVIDER_SITE_OTHER): Payer: Medicare Other | Admitting: Internal Medicine

## 2016-03-16 VITALS — BP 140/78 | HR 72 | Ht 61.5 in | Wt 193.4 lb

## 2016-03-16 DIAGNOSIS — R002 Palpitations: Secondary | ICD-10-CM | POA: Insufficient documentation

## 2016-03-16 DIAGNOSIS — Z9889 Other specified postprocedural states: Secondary | ICD-10-CM | POA: Diagnosis not present

## 2016-03-16 DIAGNOSIS — Z8679 Personal history of other diseases of the circulatory system: Secondary | ICD-10-CM | POA: Diagnosis not present

## 2016-03-16 DIAGNOSIS — I10 Essential (primary) hypertension: Secondary | ICD-10-CM

## 2016-03-16 DIAGNOSIS — E785 Hyperlipidemia, unspecified: Secondary | ICD-10-CM | POA: Diagnosis not present

## 2016-03-16 MED ORDER — METOPROLOL SUCCINATE ER 25 MG PO TB24: 25.0000 mg | ORAL_TABLET | Freq: Every day | ORAL | 11 refills | Status: DC

## 2016-03-16 NOTE — Progress Notes (Signed)
OFFICE NOTE  Chief Complaint:  No complaints  Primary Care Physician: Glo Herring., MD  HPI:  Elizabeth Meza  is a 71 year old female with history of temporal arteritis in the past; however, never saw a rheumatologist for that. She also has an abdominal aortic aneurysm measuring up to 5 cm and an ascending aortic aneurysm that measured 4.6 cm on echo. She is a smoker and had been working on smoking cessation, and she quit for a short period of time, however, restarted. She is on Wellbutrin for that. I had referred her to see Dr. Trudie Reed in [facility]; however, she never went to that appointment. Her EF is between 50% to 55% with mild diastolic dysfunction.  Her CT scan demonstrated enlargement of her aneurysms and I referred her to Dr. Roxan Hockey. He evaluated her studies and felt that her repair would be too complex and recommended that she be referred to [facility] to see Dr. Mali Hughes.  She ultimately underwent breath abdominal aneurysm repair by Dr. Ysidro Evert with a long hospitalization, but was discharged returns in followup today. Her surgery was deemed successful, however she still has some residual effects including some weakness in the right leg, back pain, and continued fatigue. Dr. Ysidro Evert recommended reducing her atenolol with a plan to try to wean that off. He was taking this medication for her aneurysms and not hypertension. Unfortunately she continues to smoke. She is also complaining of loss of appetite, weight loss and some constipation.  Finally she had recent laboratory work which shows a well-controlled lipid profile. Her LDL particle number is around 800 with an LDL C. of 50. Renal function and the rest of her metabolic profile were within normal limits.  Elizabeth Meza returns for followup again today. I recently received the data from [facility] including a CT scan which shows stability of her rectal abdominal aortic repair. She has been weaned off of  atenolol but is having some palpitations and PACs. She has the diagnosis of giant cell arteritis which is confirmed by temporal artery biopsy as well as biopsy from the aorta post surgical. She has been followed by Dr. Annye Asa with rheumatology. She's currently not on steroids.  She is planning and getting bilateral cataract surgery in the near future with intraocular lens implant to correct her astigmatism.  Etienne returns today for follow-up. She denies any chest pain or worsening shortness of breath. She is having some left shoulder pain which is arthritic in nature. She is scheduled to see Dr. Ysidro Evert back at [facility] in April for follow-up of her aneurysm repairs. I suspect that we will follow her forword for stability of those repairs based on his recommendations. She recently had laboratory work including a lipid profile which showed total cholesterol of 161 and LDL of 86.  I had the pleasure of seeing Elizabeth Meza back in the office today for follow-up. She is complaining of some chest tightness. She says this is been chronic and seems to be on a daily basis ever since her surgery. Interestingly, however she reports that the symptoms get somewhat worse with exertion and are relieved by rest. She effectively doing certain activities because of the chest discomfort. Preoperatively she had cardiac catheterization which showed no obstructive coronary disease performed by myself in 2013.  Elizabeth Meza returns today for follow-up of her chest tightness. In the interim I gave her some isosorbide to  try for medical therapy. She says the medicine gave her headaches and did not improve her chest tightness. As it was mentioned she had no coronary artery disease by cath in 2013 prior to her aneurysm repair. It may be that there is another cause of her chest discomfort. She is however having frequent ventricular ectopy. She is on low-dose atenolol. I gave her Wellbutrin to use for smoking cessation at her  request however she said this made her nauseated. She says that she may do better on the once daily ask our version. She wants to try that. Blood pressure however is not been well controlled and currently is 162/82.  06/10/2015  Elizabeth Meza was seen back today in the office for follow-up. She reports some improvement in her palpitations with switching from atenolol to metoprolol. She has cut back on her smoking but not yet finished it. She is on Wellbutrin XL and was intolerant of short acting Wellbutrin. We talked about continuing this longer however, she says she wishes to wean off of it at this time. I advised her that she may be more likely to go back to smoking but she seemed to dismiss this.  03/16/2016  Elizabeth Meza returns today for follow-up. She has almost stopped smoking. She did use Wellbutrin and had marked improvement in her cigarette use however she says she still has some stress and occasionally will smoke a cigarette. She reports her palpitations have improved and she is unaware of it although she continues to have some PACs. With regards to her prior arthroscopy abdominal aneurysm repair, she did follow-up at [facility] who recommended her to follow-up in 18 months. She is having some pain around her incision site which they thought was related to a scar tissue. Dr. Ysidro Evert also mentioned that she may want to consider going on to some steroids given her history of GCA. She was previously seen Dr. Ouida Sills with rheumatology however he has left the area. She does not currently have a rheumatologist but reports that her primary care provider recently checked sedimentation rate and CRP which were not elevated.  PMHx:  Past Medical History:  Diagnosis Date  . Abdominal aneurysm ([place])   . Abdominal aortic aneurysm (HCC)    5cm & ascending aortic aneursym 4.5cm  . Arthritis   . Carotid artery disease (HCC)    mild - carotid doppler 05/2010: mild amount of fibrous plaque in bilat ICAs, occlusive disease of  L vertebral   . History of nuclear stress test 05/2010   bruce myoview; no evidence of ischemia, poor exercise tolerance  . Hypercholesteremia   . Hypertension   . Temporal arteritis (HCC)    giant cell arteritis  . Tobacco abuse     Past Surgical History:  Procedure Laterality Date  . ABDOMINAL AORTIC ANEURYSM REPAIR  02/22/2012   [facility] - supracoronary ascending aortic & hemi-arch replacement for a 6cm ascending aortic & prox arch aneursym  . BREAST SURGERY     biopsyx3  . HEMORROIDECTOMY    . LEFT HEART CATHETERIZATION WITH CORONARY ANGIOGRAM N/A 11/14/2011   Procedure: LEFT HEART CATHETERIZATION WITH CORONARY ANGIOGRAM;  Surgeon: Pixie Casino, MD;  Location: [facility] CATH LAB;  Service: Cardiovascular;  Laterality: N/A;  . TEMPORAL ARTERY BIOPSY / LIGATION  2006  . TRANSTHORACIC ECHOCARDIOGRAM  10/07/2010   EF=50-55%, LV borderline dilated; mild mitral annular calcif; mild TR;   . WRIST SURGERY  2008    FAMHx:  Family History  Problem Relation Age of Onset  .  COPD Mother   . Cancer Father   . Heart disease Sister   . Hyperlipidemia Sister     x3  . Hypertension Sister   . Heart disease Brother     hyperlipidemia  . Stroke Maternal Grandfather   . Kidney disease Maternal Grandfather   . Diabetes Maternal Grandmother   . Stroke Paternal Grandmother   . Heart attack Paternal Grandfather   . Parkinson's disease Sister     also diabetes    SOCHx:   reports that she has quit smoking. Her smoking use included Cigarettes. She has a 20.00 pack-year smoking history. She has never used smokeless tobacco. She reports that she does not drink alcohol or use drugs.  ALLERGIES:  Allergies  Allergen Reactions  . Septra [Sulfamethoxazole-Trimethoprim]   . Sulfa Antibiotics Rash    ROS: Pertinent items noted in HPI and remainder of comprehensive ROS otherwise negative.  HOME MEDS: Current Outpatient Prescriptions  Medication Sig Dispense Refill  . acetaminophen (TYLENOL) 500 MG  tablet Take 500 mg by mouth every 6 (six) hours as needed. For pain    . aspirin EC 81 MG tablet Take 81 mg by mouth daily.    . Cyanocobalamin (VITAMIN B-12) 2500 MCG SUBL Place under the tongue daily.    . DiphenhydrAMINE HCl, Sleep, (UNISOM SLEEPGELS) 50 MG CAPS Take 50 mg by mouth at bedtime.    Marland Kitchen ibuprofen (ADVIL,MOTRIN) 200 MG tablet Take 200 mg by mouth every 8 (eight) hours as needed for mild pain.    Marland Kitchen irbesartan (AVAPRO) 150 MG tablet Take 1 tablet (150 mg total) by mouth daily. 90 tablet 1  . metoprolol succinate (TOPROL-XL) 25 MG 24 hr tablet Take 1 tablet (25 mg total) by mouth daily. 30 tablet 11  . simvastatin (ZOCOR) 20 MG tablet Take 1 tablet (20 mg total) by mouth at bedtime. 90 tablet 3   No current facility-administered medications for this visit.     LABS/IMAGING: No results found for this or any previous visit (from the past 48 hour(s)). No results found.  VITALS: BP 140/78   Pulse 72   Ht 5' 1.5" (1.562 m)   Wt 193 lb 6.4 oz (87.7 kg)   BMI 35.95 kg/m   EXAM: General appearance: alert and no distress Lungs: clear to auscultation bilaterally Heart: regular rate and rhythm, S1, S2 normal, no murmur, click, rub or gallop Extremities: extremities normal, atraumatic, no cyanosis or edema Neurologic: Grossly normal Psych: Pleasant  EKG: Sinus rhythm with PACs, nonspecific ST and T wave changes at 72  ASSESSMENT: 1. History of thoracic abdominal aneurysm status post aortic aneurysm repair by Dr. Ysidro Evert at [facility] (2014) 2. Tobacco dependence 3. Mild carotid artery disease bilaterally 4. Obesity - ongoing weight loss 5. Dyslipidemia - well, if not over-controlled 6. Giant cell arteritis 7. PACs/PVC's - improved on metoprolol  PLAN: 1.   Elizabeth Meza continues to do well after her thoracicoabdominal aortic aneurysm repair at [facility] in 2014. She has managed to almost stop smoking completely. Unfortunate she's gained some expected weight back. I've encouraged her to  work on dietary changes and I recommended the DASH diet. She said recently she's bouts more fruits and vegetables and will stop eating doughnuts for breakfast. Hopefully weight loss will help additionally with the blood pressure which is top normal today but has been previous he well controlled. I do not think we need to adjust her medicines at this time. Follow-up with me in one year or sooner as necessary.  Pixie Casino, MD, [facility] Attending Cardiologist [place] 03/16/2016, 12:56 PM

## 2016-03-16 NOTE — Patient Instructions (Signed)
Your physician wants you to follow-up in: ONE YEAR with Dr. Hilty. You will receive a reminder letter in the mail two months in advance. If you don't receive a letter, please call our office to schedule the follow-up appointment.  

## 2016-04-11 ENCOUNTER — Other Ambulatory Visit: Payer: Self-pay | Admitting: Internal Medicine

## 2016-04-11 DIAGNOSIS — N3281 Overactive bladder: Secondary | ICD-10-CM | POA: Diagnosis not present

## 2016-04-11 DIAGNOSIS — Z6835 Body mass index (BMI) 35.0-35.9, adult: Secondary | ICD-10-CM | POA: Diagnosis not present

## 2016-04-11 DIAGNOSIS — I10 Essential (primary) hypertension: Secondary | ICD-10-CM | POA: Diagnosis not present

## 2016-04-11 DIAGNOSIS — R946 Abnormal results of thyroid function studies: Secondary | ICD-10-CM | POA: Diagnosis not present

## 2016-04-11 DIAGNOSIS — E6609 Other obesity due to excess calories: Secondary | ICD-10-CM | POA: Diagnosis not present

## 2016-04-11 DIAGNOSIS — M316 Other giant cell arteritis: Secondary | ICD-10-CM | POA: Diagnosis not present

## 2016-04-11 DIAGNOSIS — I2510 Atherosclerotic heart disease of native coronary artery without angina pectoris: Secondary | ICD-10-CM | POA: Diagnosis not present

## 2016-04-11 DIAGNOSIS — Z1389 Encounter for screening for other disorder: Secondary | ICD-10-CM | POA: Diagnosis not present

## 2016-04-11 DIAGNOSIS — E748 Other specified disorders of carbohydrate metabolism: Secondary | ICD-10-CM | POA: Diagnosis not present

## 2016-04-11 DIAGNOSIS — Z0001 Encounter for general adult medical examination with abnormal findings: Secondary | ICD-10-CM | POA: Diagnosis not present

## 2016-04-11 NOTE — Telephone Encounter (Signed)
Rx request sent to pharmacy.  

## 2016-05-16 DIAGNOSIS — E748 Other specified disorders of carbohydrate metabolism: Secondary | ICD-10-CM | POA: Diagnosis not present

## 2016-08-28 ENCOUNTER — Other Ambulatory Visit: Payer: Self-pay | Admitting: Internal Medicine

## 2016-09-26 ENCOUNTER — Other Ambulatory Visit: Payer: Self-pay | Admitting: Internal Medicine

## 2016-09-26 DIAGNOSIS — I10 Essential (primary) hypertension: Secondary | ICD-10-CM

## 2016-09-26 NOTE — Telephone Encounter (Signed)
Rx(s) sent to pharmacy electronically.  

## 2016-10-06 DIAGNOSIS — Z8679 Personal history of other diseases of the circulatory system: Secondary | ICD-10-CM | POA: Diagnosis not present

## 2016-10-06 DIAGNOSIS — I716 Thoracoabdominal aortic aneurysm, without rupture: Secondary | ICD-10-CM | POA: Diagnosis not present

## 2016-10-06 DIAGNOSIS — R9431 Abnormal electrocardiogram [ECG] [EKG]: Secondary | ICD-10-CM | POA: Diagnosis not present

## 2016-10-06 DIAGNOSIS — Z9889 Other specified postprocedural states: Secondary | ICD-10-CM | POA: Diagnosis not present

## 2016-10-06 DIAGNOSIS — F17210 Nicotine dependence, cigarettes, uncomplicated: Secondary | ICD-10-CM | POA: Diagnosis not present

## 2016-10-06 DIAGNOSIS — M316 Other giant cell arteritis: Secondary | ICD-10-CM | POA: Diagnosis not present

## 2016-11-01 DIAGNOSIS — Z23 Encounter for immunization: Secondary | ICD-10-CM | POA: Diagnosis not present

## 2017-06-05 DIAGNOSIS — N3281 Overactive bladder: Secondary | ICD-10-CM | POA: Diagnosis not present

## 2017-06-05 DIAGNOSIS — Z1389 Encounter for screening for other disorder: Secondary | ICD-10-CM | POA: Diagnosis not present

## 2017-06-05 DIAGNOSIS — I10 Essential (primary) hypertension: Secondary | ICD-10-CM | POA: Diagnosis not present

## 2017-06-05 DIAGNOSIS — J329 Chronic sinusitis, unspecified: Secondary | ICD-10-CM | POA: Diagnosis not present

## 2017-06-05 DIAGNOSIS — Z0001 Encounter for general adult medical examination with abnormal findings: Secondary | ICD-10-CM | POA: Diagnosis not present

## 2017-06-05 DIAGNOSIS — E782 Mixed hyperlipidemia: Secondary | ICD-10-CM | POA: Diagnosis not present

## 2017-06-10 DIAGNOSIS — Z1211 Encounter for screening for malignant neoplasm of colon: Secondary | ICD-10-CM | POA: Diagnosis not present

## 2017-06-11 ENCOUNTER — Other Ambulatory Visit (HOSPITAL_COMMUNITY): Payer: Self-pay | Admitting: Internal Medicine

## 2017-06-11 DIAGNOSIS — N6320 Unspecified lump in the left breast, unspecified quadrant: Secondary | ICD-10-CM

## 2017-06-18 ENCOUNTER — Other Ambulatory Visit: Payer: Self-pay | Admitting: Internal Medicine

## 2017-06-19 ENCOUNTER — Ambulatory Visit (HOSPITAL_COMMUNITY)
Admission: RE | Admit: 2017-06-19 | Discharge: 2017-06-19 | Disposition: A | Discharge status: Home / Self Care | Payer: Medicare Other | Source: Ambulatory Visit | Attending: Internal Medicine | Admitting: Internal Medicine

## 2017-06-19 ENCOUNTER — Encounter (HOSPITAL_COMMUNITY): Payer: Self-pay

## 2017-06-19 DIAGNOSIS — N6320 Unspecified lump in the left breast, unspecified quadrant: Secondary | ICD-10-CM | POA: Diagnosis not present

## 2017-06-19 DIAGNOSIS — R928 Other abnormal and inconclusive findings on diagnostic imaging of breast: Secondary | ICD-10-CM | POA: Diagnosis not present

## 2017-06-25 ENCOUNTER — Other Ambulatory Visit: Payer: Self-pay | Admitting: Internal Medicine

## 2017-06-25 DIAGNOSIS — N6489 Other specified disorders of breast: Secondary | ICD-10-CM

## 2017-06-28 ENCOUNTER — Other Ambulatory Visit: Payer: Self-pay | Admitting: Internal Medicine

## 2017-06-28 ENCOUNTER — Ambulatory Visit
Admission: RE | Admit: 2017-06-28 | Discharge: 2017-06-28 | Disposition: A | Discharge status: Home / Self Care | Payer: Medicare Other | Source: Ambulatory Visit | Attending: Internal Medicine | Admitting: Internal Medicine

## 2017-06-28 DIAGNOSIS — N6489 Other specified disorders of breast: Secondary | ICD-10-CM

## 2017-06-28 DIAGNOSIS — R928 Other abnormal and inconclusive findings on diagnostic imaging of breast: Secondary | ICD-10-CM | POA: Diagnosis not present

## 2017-06-28 DIAGNOSIS — N6011 Diffuse cystic mastopathy of right breast: Secondary | ICD-10-CM | POA: Diagnosis not present

## 2017-08-24 ENCOUNTER — Other Ambulatory Visit: Payer: Self-pay

## 2017-08-24 MED ORDER — SIMVASTATIN 20 MG PO TABS: 20.0000 mg | ORAL_TABLET | Freq: Every day | ORAL | 0 refills | Status: DC

## 2017-10-05 ENCOUNTER — Ambulatory Visit: Payer: Medicare Other | Admitting: Internal Medicine

## 2017-10-05 ENCOUNTER — Encounter: Payer: Self-pay | Admitting: Internal Medicine

## 2017-10-05 VITALS — BP 135/64 | HR 71 | Ht 62.0 in | Wt 185.2 lb

## 2017-10-05 DIAGNOSIS — R002 Palpitations: Secondary | ICD-10-CM

## 2017-10-05 DIAGNOSIS — I10 Essential (primary) hypertension: Secondary | ICD-10-CM

## 2017-10-05 DIAGNOSIS — Z9889 Other specified postprocedural states: Secondary | ICD-10-CM

## 2017-10-05 DIAGNOSIS — Z8679 Personal history of other diseases of the circulatory system: Secondary | ICD-10-CM

## 2017-10-05 DIAGNOSIS — E785 Hyperlipidemia, unspecified: Secondary | ICD-10-CM | POA: Diagnosis not present

## 2017-10-05 MED ORDER — IRBESARTAN 150 MG PO TABS: ORAL_TABLET | ORAL | 11 refills | Status: DC

## 2017-10-05 NOTE — Progress Notes (Signed)
OFFICE NOTE  Chief Complaint:  No complaints  Primary Care Physician: Redmond School, MD  HPI:  Elizabeth Meza  is a 72 year old female with history of temporal arteritis in the past; however, never saw a rheumatologist for that. She also has an abdominal aortic aneurysm measuring up to 5 cm and an ascending aortic aneurysm that measured 4.6 cm on echo. She is a smoker and had been working on smoking cessation, and she quit for a short period of time, however, restarted. She is on Wellbutrin for that. I had referred her to see Dr. Trudie Reed in [facility]; however, she never went to that appointment. Her EF is between 50% to 55% with mild diastolic dysfunction.  Her CT scan demonstrated enlargement of her aneurysms and I referred her to Dr. Roxan Hockey. He evaluated her studies and felt that her repair would be too complex and recommended that she be referred to [facility] to see Dr. Mali Hughes.  She ultimately underwent breath abdominal aneurysm repair by Dr. Ysidro Evert with a long hospitalization, but was discharged returns in followup today. Her surgery was deemed successful, however she still has some residual effects including some weakness in the right leg, back pain, and continued fatigue. Dr. Ysidro Evert recommended reducing her atenolol with a plan to try to wean that off. He was taking this medication for her aneurysms and not hypertension. Unfortunately she continues to smoke. She is also complaining of loss of appetite, weight loss and some constipation.  Finally she had recent laboratory work which shows a well-controlled lipid profile. Her LDL particle number is around 800 with an LDL C. of 50. Renal function and the rest of her metabolic profile were within normal limits.  Elizabeth Meza returns for followup again today. I recently received the data from [facility] including a CT scan which shows stability of her rectal abdominal aortic repair. She has been weaned off of  atenolol but is having some palpitations and PACs. She has the diagnosis of giant cell arteritis which is confirmed by temporal artery biopsy as well as biopsy from the aorta post surgical. She has been followed by Dr. Annye Asa with rheumatology. She's currently not on steroids.  She is planning and getting bilateral cataract surgery in the near future with intraocular lens implant to correct her astigmatism.  Elizabeth Meza returns today for follow-up. She denies any chest pain or worsening shortness of breath. She is having some left shoulder pain which is arthritic in nature. She is scheduled to see Dr. Ysidro Evert back at [facility] in April for follow-up of her aneurysm repairs. I suspect that we will follow her forword for stability of those repairs based on his recommendations. She recently had laboratory work including a lipid profile which showed total cholesterol of 161 and LDL of 86.  I had the pleasure of seeing Miss Meza back in the office today for follow-up. She is complaining of some chest tightness. She says this is been chronic and seems to be on a daily basis ever since her surgery. Interestingly, however she reports that the symptoms get somewhat worse with exertion and are relieved by rest. She effectively doing certain activities because of the chest discomfort. Preoperatively she had cardiac catheterization which showed no obstructive coronary disease performed by myself in 2013.  Elizabeth Meza returns today for follow-up of her chest tightness. In the interim I gave her some isosorbide to  try for medical therapy. She says the medicine gave her headaches and did not improve her chest tightness. As it was mentioned she had no coronary artery disease by cath in 2013 prior to her aneurysm repair. It may be that there is another cause of her chest discomfort. She is however having frequent ventricular ectopy. She is on low-dose atenolol. I gave her Wellbutrin to use for smoking cessation at her  request however she said this made her nauseated. She says that she may do better on the once daily ask our version. She wants to try that. Blood pressure however is not been well controlled and currently is 162/82.  06/10/2015  Elizabeth Meza was seen back today in the office for follow-up. She reports some improvement in her palpitations with switching from atenolol to metoprolol. She has cut back on her smoking but not yet finished it. She is on Wellbutrin XL and was intolerant of short acting Wellbutrin. We talked about continuing this longer however, she says she wishes to wean off of it at this time. I advised her that she may be more likely to go back to smoking but she seemed to dismiss this.  03/16/2016  Elizabeth Meza returns today for follow-up. She has almost stopped smoking. She did use Wellbutrin and had marked improvement in her cigarette use however she says she still has some stress and occasionally will smoke a cigarette. She reports her palpitations have improved and she is unaware of it although she continues to have some PACs. With regards to her prior arthroscopy abdominal aneurysm repair, she did follow-up at [facility] who recommended her to follow-up in 18 months. She is having some pain around her incision site which they thought was related to a scar tissue. Dr. Ysidro Evert also mentioned that she may want to consider going on to some steroids given her history of GCA. She was previously seen Dr. Ouida Sills with rheumatology however he has left the area. She does not currently have a rheumatologist but reports that her primary care provider recently checked sedimentation rate and CRP which were not elevated.  10/05/2017  Elizabeth Meza returns today for follow-up.  Overall she is feeling well denies chest pain or worsening shortness of breath.  She said she ran out of her blood pressure medicine but blood pressure is top normal today.  She remains on the metoprolol.  Cholesterol is also from her previous office  visit.  She is been compliant with simvastatin however LDL is up to 99.  Goal is less than 70.  EKG is stable with sinus rhythm and some sinus arrhythmia.  She is not aware of this.  PMHx:  Past Medical History:  Diagnosis Date  . Abdominal aneurysm ([place])   . Abdominal aortic aneurysm (HCC)    5cm & ascending aortic aneursym 4.5cm  . Arthritis   . Carotid artery disease (HCC)    mild - carotid doppler 05/2010: mild amount of fibrous plaque in bilat ICAs, occlusive disease of L vertebral   . History of nuclear stress test 05/2010   bruce myoview; no evidence of ischemia, poor exercise tolerance  . Hypercholesteremia   . Hypertension   . Temporal arteritis (HCC)    giant cell arteritis  . Tobacco abuse     Past Surgical History:  Procedure Laterality Date  . ABDOMINAL AORTIC ANEURYSM REPAIR  02/22/2012   [facility] - supracoronary ascending aortic & hemi-arch replacement for a 6cm ascending aortic & prox arch aneursym  . BREAST SURGERY  biopsyx3  . HEMORROIDECTOMY    . LEFT HEART CATHETERIZATION WITH CORONARY ANGIOGRAM N/A 11/14/2011   Procedure: LEFT HEART CATHETERIZATION WITH CORONARY ANGIOGRAM;  Surgeon: Pixie Casino, MD;  Location: [facility] CATH LAB;  Service: Cardiovascular;  Laterality: N/A;  . TEMPORAL ARTERY BIOPSY / LIGATION  2006  . TRANSTHORACIC ECHOCARDIOGRAM  10/07/2010   EF=50-55%, LV borderline dilated; mild mitral annular calcif; mild TR;   . WRIST SURGERY  2008    FAMHx:  Family History  Problem Relation Age of Onset  . COPD Mother   . Cancer Father   . Heart disease Sister   . Hyperlipidemia Sister        x3  . Hypertension Sister   . Heart disease Brother        hyperlipidemia  . Stroke Maternal Grandfather   . Kidney disease Maternal Grandfather   . Diabetes Maternal Grandmother   . Stroke Paternal Grandmother   . Heart attack Paternal Grandfather   . Parkinson's disease Sister        also diabetes    SOCHx:   reports that she has quit smoking. Her  smoking use included cigarettes. She has a 20.00 pack-year smoking history. She has never used smokeless tobacco. She reports that she does not drink alcohol or use drugs.  ALLERGIES:  Allergies  Allergen Reactions  . Septra [Sulfamethoxazole-Trimethoprim]   . Sulfa Antibiotics Rash    ROS: Pertinent items noted in HPI and remainder of comprehensive ROS otherwise negative.  HOME MEDS: Current Outpatient Medications  Medication Sig Dispense Refill  . aspirin EC 81 MG tablet Take 81 mg by mouth daily.    . Cyanocobalamin (VITAMIN B-12) 2500 MCG SUBL Place under the tongue daily.    . DiphenhydrAMINE HCl, Sleep, (UNISOM SLEEPGELS) 50 MG CAPS Take 50 mg by mouth at bedtime.    . metoprolol succinate (TOPROL-XL) 25 MG 24 hr tablet TAKE ONE (1) TABLET BY MOUTH EVERY DAY 90 tablet 3  . oxybutynin (DITROPAN) 5 MG/5ML syrup Take 5 mg by mouth 2 (two) times daily.    . simvastatin (ZOCOR) 20 MG tablet Take 1 tablet (20 mg total) by mouth at bedtime. NEED OV. 90 tablet 0  . acetaminophen (TYLENOL) 500 MG tablet Take 500 mg by mouth every 6 (six) hours as needed. For pain    . irbesartan (AVAPRO) 150 MG tablet TAKE ONE (1) TABLET BY MOUTH EVERY DAY (Patient not taking: Reported on 10/05/2017) 90 tablet 3   No current facility-administered medications for this visit.     LABS/IMAGING: No results found for this or any previous visit (from the past 48 hour(s)). No results found.  VITALS: BP 135/64   Pulse 71   Ht 5\' 2"  (1.575 m)   Wt 185 lb 3.2 oz (84 kg)   BMI 33.87 kg/m   EXAM: General appearance: alert, no distress and moderately obese Neck: no carotid bruit, no JVD and thyroid not enlarged, symmetric, no tenderness/mass/nodules Lungs: clear to auscultation bilaterally Heart: regular rate and rhythm, S1, S2 normal, no murmur, click, rub or gallop Abdomen: soft, non-tender; bowel sounds normal; no masses,  no organomegaly Extremities: extremities normal, atraumatic, no cyanosis or  edema Pulses: 2+ and symmetric Skin: Skin color, texture, turgor normal. No rashes or lesions Neurologic: Grossly normal Psych: Pleasant  EKG: Sinus rhythm with marked sinus arrhythmia at 71, anterolateral ST and T wave changes-personally reviewed  ASSESSMENT: 1. History of thoracic abdominal aneurysm status post aortic aneurysm repair by Dr. Ysidro Evert at [facility] (  2014) 2. Tobacco dependence 3. Mild carotid artery disease bilaterally 4. Obesity - ongoing weight loss 5. Dyslipidemia 6. Giant cell arteritis 7. PACs/PVC's - improved on metoprolol  PLAN: 1.   Elizabeth Meza seems to be doing well and is asymptomatic.  Unfortunately there is ongoing smoking use.  Her weight has gone up and cholesterol is worse.  She needs to continue to work on weight loss and dietary changes including reducing saturated fat.  Her cholesterol is above goal LDL less than 70.  She still having extra beats but is unaware of that.  She ran out of her irbesartan and will refill that today.   Follow-up with me annually or sooner as necessary.  Pixie Casino, MD, [facility] [place] Director of the [facility] Lipid Disorders &  Cardiovascular Risk Reduction [facility] Diplomate of the American Board of Clinical Lipidology Attending Cardiologist  Direct Dial: [phone]  Fax: [phone]  Website:  [email or web address] 10/05/2017, 10:44 AM

## 2017-10-05 NOTE — Patient Instructions (Signed)
Your physician wants you to follow-up in: ONE YEAR with Dr. Debara Pickett. You will receive a reminder via MyChart when it is time to call and schedule an appointment.

## 2017-10-09 DIAGNOSIS — Z23 Encounter for immunization: Secondary | ICD-10-CM | POA: Diagnosis not present

## 2017-10-21 IMAGING — US US BREAST LTD UNI LEFT INC AXILLA
1 series · 5 of 5 positions shown · non-contrast
Comparison: 12/16/2014

CLINICAL DATA: Patient returns after new baseline screening for
evaluation of possible left breast mass.

EXAM:
DIGITAL DIAGNOSTIC LEFT MAMMOGRAM WITH 3D TOMOSYNTHESIS
ULTRASOUND LEFT BREAST

[Series 1: us breast ltd uni left inc axilla · 0.07mm/px · 5 of 5 slices shown]
[im 1/5]
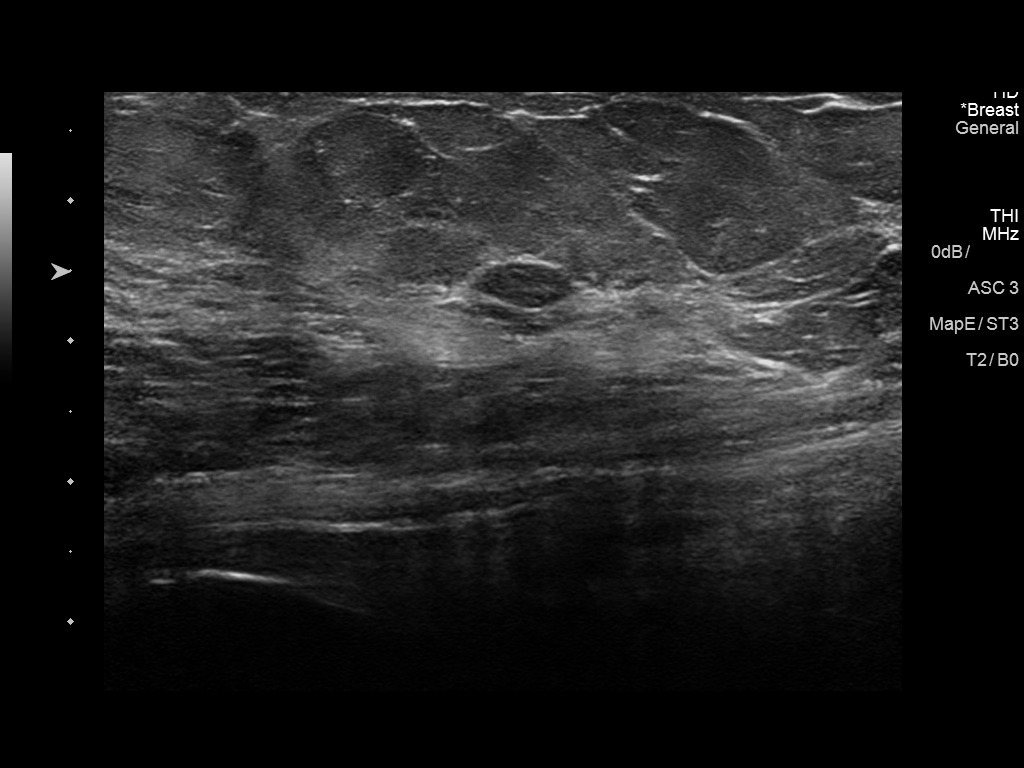
[im 2/5]
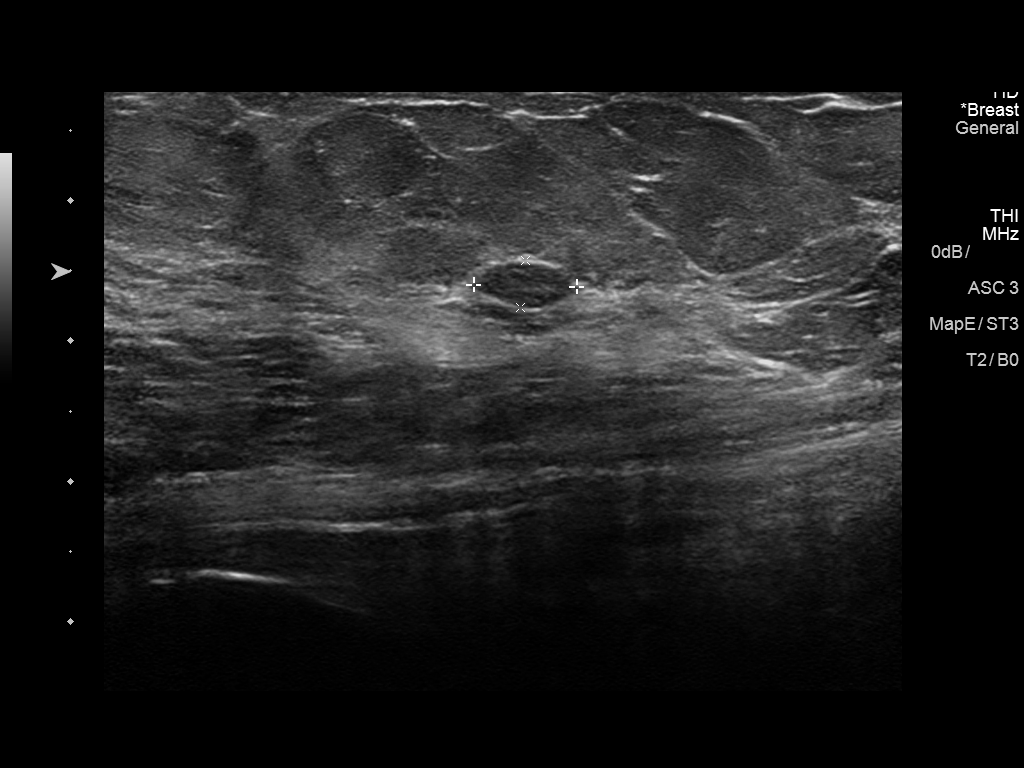
[im 3/5]
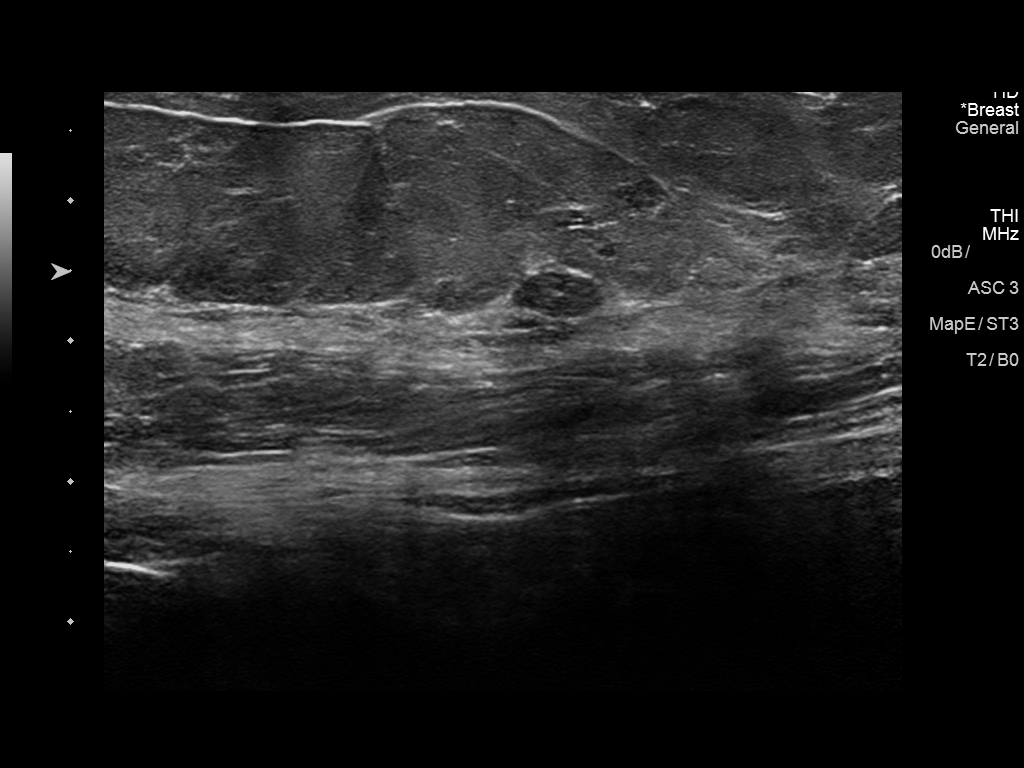
[im 4/5]
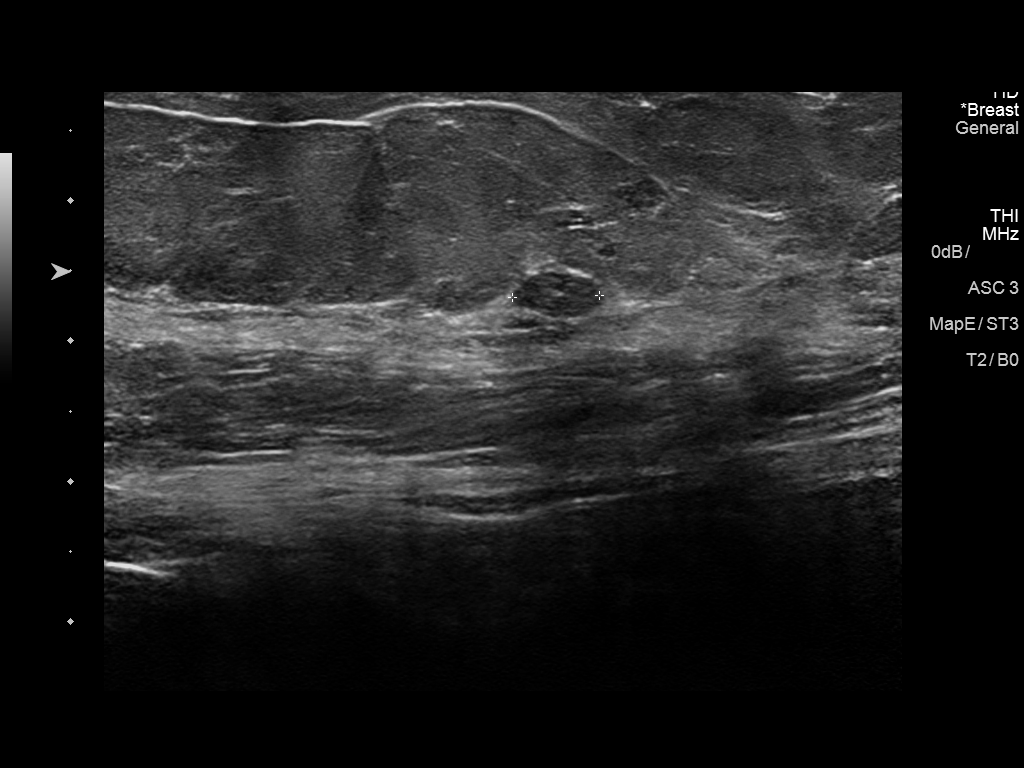
[im 5/5]
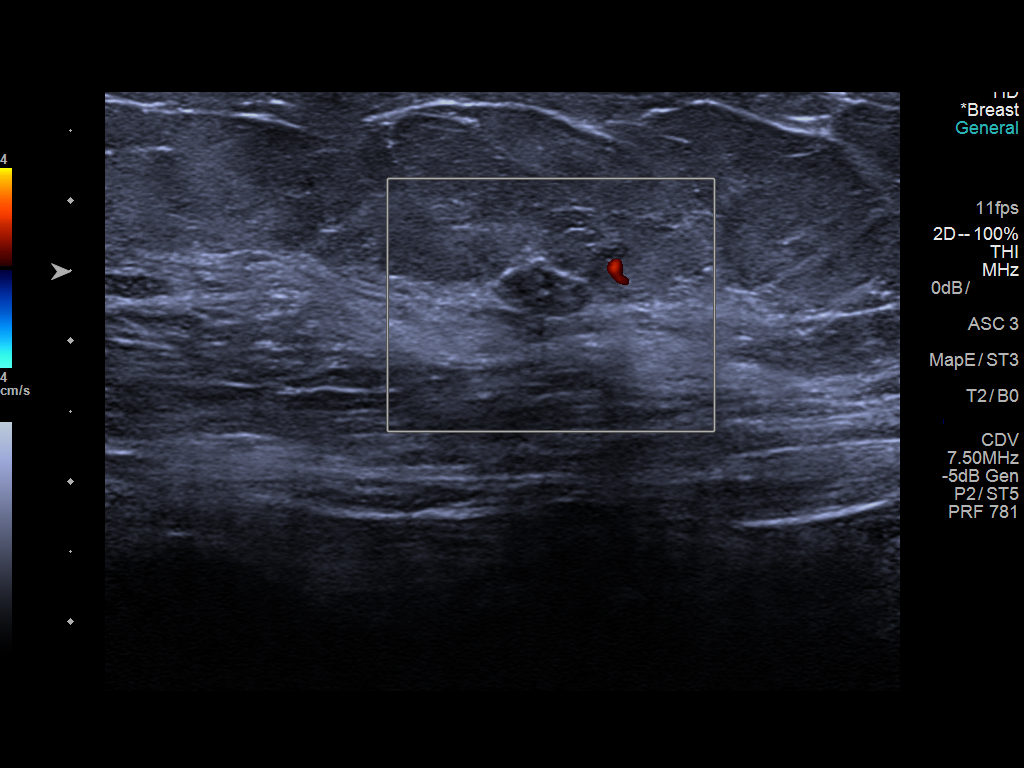

[5 of 5 positions shown; findings below may reference images not displayed]

ACR Breast Density Category b: There are scattered areas of
fibroglandular density.
FINDINGS: Additional views are performed, confirming presence of an oval mass
just lateral to the left nipple. This is partially obscured by
surrounding breast parenchyma.

On physical exam, I palpate no abnormality in the lateral aspect of
the left breast appear

Targeted ultrasound is performed, showing a circumscribed hypoechoic
nodule in the 3 o'clock location of the left breast 3 cm from the
nipple which measures 0.7 x 0.3 x 0.6 cm. Mass is parallel and is
associated with increased through transmission. No suspicious
features identified.
IMPRESSION: Persistent abnormality corresponds to a benign-appearing nodule,
likely fibroadenoma, by ultrasound. We discussed management options
including excision, biopsy, and close follow-up. Imaging followup is
recommended at 6, 12, and 24 months to assess stability. The patient
concurs with this plan.

RECOMMENDATION:
Left breast ultrasound is suggested in 6 months.

I have discussed the findings and recommendations with the patient.
Results were also provided in writing at the conclusion of the
visit. If applicable, a reminder letter will be sent to the patient
regarding the next appointment.

BI-RADS CATEGORY  3: Probably benign.

## 2017-10-25 ENCOUNTER — Other Ambulatory Visit: Payer: Self-pay | Admitting: Internal Medicine

## 2017-10-25 DIAGNOSIS — I10 Essential (primary) hypertension: Secondary | ICD-10-CM

## 2017-11-15 ENCOUNTER — Other Ambulatory Visit: Payer: Self-pay

## 2017-11-15 NOTE — Patient Outreach (Signed)
[facility]) Care Management  11/15/2017  Elizabeth Meza 1945/10/15 [id number]   Medication Adherence call to [name] left a message for patient to call back patient is due on Irbesartan  150 mg. [name] is showing past due under [name] [employer] care Ins.   [facility] Management Direct Dial [phone]  Fax [phone] [email or web address]

## 2017-11-19 ENCOUNTER — Other Ambulatory Visit: Payer: Self-pay | Admitting: Internal Medicine

## 2017-11-19 DIAGNOSIS — I10 Essential (primary) hypertension: Secondary | ICD-10-CM

## 2017-12-20 ENCOUNTER — Other Ambulatory Visit: Payer: Self-pay | Admitting: Internal Medicine

## 2018-03-18 ENCOUNTER — Other Ambulatory Visit: Payer: Self-pay | Admitting: Internal Medicine

## 2018-05-21 DIAGNOSIS — Z1389 Encounter for screening for other disorder: Secondary | ICD-10-CM | POA: Diagnosis not present

## 2018-05-21 DIAGNOSIS — M316 Other giant cell arteritis: Secondary | ICD-10-CM | POA: Diagnosis not present

## 2018-05-21 DIAGNOSIS — Z0001 Encounter for general adult medical examination with abnormal findings: Secondary | ICD-10-CM | POA: Diagnosis not present

## 2018-05-21 DIAGNOSIS — I2510 Atherosclerotic heart disease of native coronary artery without angina pectoris: Secondary | ICD-10-CM | POA: Diagnosis not present

## 2018-07-01 DIAGNOSIS — M545 Low back pain: Secondary | ICD-10-CM | POA: Diagnosis not present

## 2018-07-01 DIAGNOSIS — L139 Bullous disorder, unspecified: Secondary | ICD-10-CM | POA: Diagnosis not present

## 2018-07-01 DIAGNOSIS — I10 Essential (primary) hypertension: Secondary | ICD-10-CM | POA: Diagnosis not present

## 2018-07-01 DIAGNOSIS — Z1389 Encounter for screening for other disorder: Secondary | ICD-10-CM | POA: Diagnosis not present

## 2018-07-01 DIAGNOSIS — M5432 Sciatica, left side: Secondary | ICD-10-CM | POA: Diagnosis not present

## 2018-07-03 DIAGNOSIS — M5432 Sciatica, left side: Secondary | ICD-10-CM | POA: Diagnosis not present

## 2018-07-09 ENCOUNTER — Other Ambulatory Visit: Payer: Self-pay | Admitting: Internal Medicine

## 2018-10-03 DIAGNOSIS — L821 Other seborrheic keratosis: Secondary | ICD-10-CM | POA: Diagnosis not present

## 2018-10-03 DIAGNOSIS — L818 Other specified disorders of pigmentation: Secondary | ICD-10-CM | POA: Diagnosis not present

## 2018-11-04 ENCOUNTER — Other Ambulatory Visit: Payer: Self-pay | Admitting: Internal Medicine

## 2018-11-04 DIAGNOSIS — I10 Essential (primary) hypertension: Secondary | ICD-10-CM

## 2018-12-10 ENCOUNTER — Other Ambulatory Visit: Payer: Self-pay | Admitting: Internal Medicine

## 2018-12-10 DIAGNOSIS — I10 Essential (primary) hypertension: Secondary | ICD-10-CM

## 2018-12-13 ENCOUNTER — Other Ambulatory Visit: Payer: Self-pay

## 2018-12-13 ENCOUNTER — Ambulatory Visit: Payer: Medicare Other | Admitting: Internal Medicine

## 2018-12-13 VITALS — BP 130/76 | HR 74 | Ht 62.0 in | Wt 176.2 lb

## 2018-12-13 DIAGNOSIS — I10 Essential (primary) hypertension: Secondary | ICD-10-CM | POA: Diagnosis not present

## 2018-12-13 DIAGNOSIS — Z79899 Other long term (current) drug therapy: Secondary | ICD-10-CM | POA: Diagnosis not present

## 2018-12-13 DIAGNOSIS — Z9889 Other specified postprocedural states: Secondary | ICD-10-CM | POA: Diagnosis not present

## 2018-12-13 DIAGNOSIS — Z8679 Personal history of other diseases of the circulatory system: Secondary | ICD-10-CM | POA: Diagnosis not present

## 2018-12-13 DIAGNOSIS — E785 Hyperlipidemia, unspecified: Secondary | ICD-10-CM | POA: Diagnosis not present

## 2018-12-13 NOTE — Progress Notes (Signed)
OFFICE NOTE  Chief Complaint:  No complaints  Primary Care Physician: Redmond School, MD  HPI:  Elizabeth Meza  is a 73 year old female with history of temporal arteritis in the past; however, never saw a rheumatologist for that. She also has an abdominal aortic aneurysm measuring up to 5 cm and an ascending aortic aneurysm that measured 4.6 cm on echo. She is a smoker and had been working on smoking cessation, and she quit for a short period of time, however, restarted. She is on Wellbutrin for that. I had referred her to see Dr. Trudie Reed in [facility]; however, she never went to that appointment. Her EF is between 50% to 55% with mild diastolic dysfunction.  Her CT scan demonstrated enlargement of her aneurysms and I referred her to Dr. Roxan Hockey. He evaluated her studies and felt that her repair would be too complex and recommended that she be referred to [facility] to see Dr. Mali Hughes.  She ultimately underwent breath abdominal aneurysm repair by Dr. Ysidro Evert with a long hospitalization, but was discharged returns in followup today. Her surgery was deemed successful, however she still has some residual effects including some weakness in the right leg, back pain, and continued fatigue. Dr. Ysidro Evert recommended reducing her atenolol with a plan to try to wean that off. He was taking this medication for her aneurysms and not hypertension. Unfortunately she continues to smoke. She is also complaining of loss of appetite, weight loss and some constipation.  Finally she had recent laboratory work which shows a well-controlled lipid profile. Her LDL particle number is around 800 with an LDL C. of 50. Renal function and the rest of her metabolic profile were within normal limits.  Elizabeth Meza returns for followup again today. I recently received the data from [facility] including a CT scan which shows stability of her rectal abdominal aortic repair. She has been weaned off of atenolol  but is having some palpitations and PACs. She has the diagnosis of giant cell arteritis which is confirmed by temporal artery biopsy as well as biopsy from the aorta post surgical. She has been followed by Dr. Annye Asa with rheumatology. She's currently not on steroids.  She is planning and getting bilateral cataract surgery in the near future with intraocular lens implant to correct her astigmatism.  Elizabeth Meza returns today for follow-up. She denies any chest pain or worsening shortness of breath. She is having some left shoulder pain which is arthritic in nature. She is scheduled to see Dr. Ysidro Evert back at [facility] in April for follow-up of her aneurysm repairs. I suspect that we will follow her forword for stability of those repairs based on his recommendations. She recently had laboratory work including a lipid profile which showed total cholesterol of 161 and LDL of 86.  I had the pleasure of seeing Elizabeth Meza back in the office today for follow-up. She is complaining of some chest tightness. She says this is been chronic and seems to be on a daily basis ever since her surgery. Interestingly, however she reports that the symptoms get somewhat worse with exertion and are relieved by rest. She effectively doing certain activities because of the chest discomfort. Preoperatively she had cardiac catheterization which showed no obstructive coronary disease performed by myself in 2013.  Elizabeth Meza returns today for follow-up of her chest tightness. In the interim I gave her some isosorbide to try for medical therapy. She says the medicine gave her headaches and did not improve her  chest tightness. As it was mentioned she had no coronary artery disease by cath in 2013 prior to her aneurysm repair. It may be that there is another cause of her chest discomfort. She is however having frequent ventricular ectopy. She is on low-dose atenolol. I gave her Wellbutrin to use for smoking cessation at her request however  she said this made her nauseated. She says that she may do better on the once daily ask our version. She wants to try that. Blood pressure however is not been well controlled and currently is 162/82.  06/10/2015  Elizabeth Meza was seen back today in the office for follow-up. She reports some improvement in her palpitations with switching from atenolol to metoprolol. She has cut back on her smoking but not yet finished it. She is on Wellbutrin XL and was intolerant of short acting Wellbutrin. We talked about continuing this longer however, she says she wishes to wean off of it at this time. I advised her that she may be more likely to go back to smoking but she seemed to dismiss this.  03/16/2016  Elizabeth Meza returns today for follow-up. She has almost stopped smoking. She did use Wellbutrin and had marked improvement in her cigarette use however she says she still has some stress and occasionally will smoke a cigarette. She reports her palpitations have improved and she is unaware of it although she continues to have some PACs. With regards to her prior arthroscopy abdominal aneurysm repair, she did follow-up at [facility] who recommended her to follow-up in 18 months. She is having some pain around her incision site which they thought was related to a scar tissue. Dr. Ysidro Evert also mentioned that she may want to consider going on to some steroids given her history of GCA. She was previously seen Dr. Ouida Sills with rheumatology however he has left the area. She does not currently have a rheumatologist but reports that her primary care provider recently checked sedimentation rate and CRP which were not elevated.  10/05/2017  Elizabeth Meza returns today for follow-up.  Overall she is feeling well denies chest pain or worsening shortness of breath.  She said she ran out of her blood pressure medicine but blood pressure is top normal today.  She remains on the metoprolol.  Cholesterol is also from her previous office visit.  She is  been compliant with simvastatin however LDL is up to 99.  Goal is less than 70.  EKG is stable with sinus rhythm and some sinus arrhythmia.  She is not aware of this.  12/13/2018  Elizabeth Meza returns today for follow-up.  Overall she is doing well without complaints.  Weight has come down further from 188 276 pounds.  She says she is not eating quite as much.  She has not had follow-up with [facility] since 2019.  She had previously undergone thoracoabdominal aneurysm repair.  At her last visit they recommended a follow-up CT angiogram of the chest, abdomen and pelvis as well as an echo.  She is also overdue for labs including a metabolic profile and lipid profile.  Her last labs showed a total cholesterol of 178, triglycerides 139, HDL 51 and LDL of 99 with a goal LDL less than 70.  PMHx:  Past Medical History:  Diagnosis Date  . Abdominal aneurysm ([place])   . Abdominal aortic aneurysm (HCC)    5cm & ascending aortic aneursym 4.5cm  . Arthritis   . Carotid artery disease (HCC)    mild - carotid doppler  05/2010: mild amount of fibrous plaque in bilat ICAs, occlusive disease of L vertebral   . History of nuclear stress test 05/2010   bruce myoview; no evidence of ischemia, poor exercise tolerance  . Hypercholesteremia   . Hypertension   . Temporal arteritis (HCC)    giant cell arteritis  . Tobacco abuse     Past Surgical History:  Procedure Laterality Date  . ABDOMINAL AORTIC ANEURYSM REPAIR  02/22/2012   [facility] - supracoronary ascending aortic & hemi-arch replacement for a 6cm ascending aortic & prox arch aneursym  . BREAST SURGERY     biopsyx3  . HEMORROIDECTOMY    . LEFT HEART CATHETERIZATION WITH CORONARY ANGIOGRAM N/A 11/14/2011   Procedure: LEFT HEART CATHETERIZATION WITH CORONARY ANGIOGRAM;  Surgeon: Pixie Casino, MD;  Location: [facility] CATH LAB;  Service: Cardiovascular;  Laterality: N/A;  . TEMPORAL ARTERY BIOPSY / LIGATION  2006  . TRANSTHORACIC ECHOCARDIOGRAM  10/07/2010   EF=50-55%, LV  borderline dilated; mild mitral annular calcif; mild TR;   . WRIST SURGERY  2008    FAMHx:  Family History  Problem Relation Age of Onset  . COPD Mother   . Cancer Father   . Heart disease Sister   . Hyperlipidemia Sister        x3  . Hypertension Sister   . Heart disease Brother        hyperlipidemia  . Stroke Maternal Grandfather   . Kidney disease Maternal Grandfather   . Diabetes Maternal Grandmother   . Stroke Paternal Grandmother   . Heart attack Paternal Grandfather   . Parkinson's disease Sister        also diabetes    SOCHx:   reports that she has quit smoking. Her smoking use included cigarettes. She has a 20.00 pack-year smoking history. She has never used smokeless tobacco. She reports that she does not drink alcohol or use drugs.  ALLERGIES:  Allergies  Allergen Reactions  . Septra [Sulfamethoxazole-Trimethoprim]   . Sulfa Antibiotics Rash    ROS: Pertinent items noted in HPI and remainder of comprehensive ROS otherwise negative.  HOME MEDS: Current Outpatient Medications  Medication Sig Dispense Refill  . acetaminophen (TYLENOL) 500 MG tablet Take 500 mg by mouth every 6 (six) hours as needed. For pain    . aspirin EC 81 MG tablet Take 81 mg by mouth daily.    . Cyanocobalamin (VITAMIN B-12) 2500 MCG SUBL Place under the tongue daily.    . metoprolol succinate (TOPROL-XL) 25 MG 24 hr tablet TAKE ONE (1) TABLET BY MOUTH EVERY DAY 30 tablet 0  . oxybutynin (DITROPAN) 5 MG/5ML syrup Take 5 mg by mouth 2 (two) times daily.    . simvastatin (ZOCOR) 20 MG tablet TAKE ONE TABLET (20MG  TOTAL) BY MOUTH ATBEDTIME 90 tablet 1  . irbesartan (AVAPRO) 150 MG tablet TAKE ONE (1) TABLET BY MOUTH EVERY DAY (Patient not taking: Reported on 12/13/2018) 30 tablet 11   No current facility-administered medications for this visit.     LABS/IMAGING: No results found for this or any previous visit (from the past 48 hour(s)). No results found.  VITALS: BP 130/76   Pulse 74    Ht 5\' 2"  (1.575 m)   Wt 176 lb 3.2 oz (79.9 kg)   SpO2 96%   BMI 32.23 kg/m   EXAM: General appearance: alert, no distress and moderately obese Neck: no carotid bruit, no JVD and thyroid not enlarged, symmetric, no tenderness/mass/nodules Lungs: clear to auscultation bilaterally Heart: regular rate  and rhythm, S1, S2 normal, no murmur, click, rub or gallop Abdomen: soft, non-tender; bowel sounds normal; no masses,  no organomegaly Extremities: extremities normal, atraumatic, no cyanosis or edema Pulses: 2+ and symmetric Skin: Skin color, texture, turgor normal. No rashes or lesions Neurologic: Grossly normal Psych: Pleasant  EKG: Sinus rhythm with marked sinus arrhythmia at 74, anterolateral ST and T wave changes,-personally reviewed  ASSESSMENT: 1. History of thoracic abdominal aneurysm status post aortic aneurysm repair by Dr. Ysidro Evert at [facility] (2014) 2. Tobacco dependence 3. Mild carotid artery disease bilaterally 4. Obesity - ongoing weight loss 5. Dyslipidemia 6. Giant cell arteritis 7. PACs/PVC's - improved on metoprolol  PLAN: 1.   Elizabeth Meza is doing well but is asymptomatic.  She needs routine follow-up of her thoracoabdominal aortic aneurysm repair with a repeat CT of the chest abdomen and pelvis as well as echocardiogram which was recommended by [facility] last year.  She does not have an upcoming follow-up appointment, possibly due to Covid scheduling issues.  Her goal LDL is less than 70 and will repeat labs to see if she needs to be adjusted for that.  Plan follow-up with me otherwise annually or sooner as necessary.  Pixie Casino, MD, [facility], [place] Director of the Advanced Lipid Disorders &  Cardiovascular Risk Reduction Clinic Diplomate of the American Board of Clinical Lipidology Attending Cardiologist  Direct Dial: [phone]  Fax: [phone]  Website:  [email or web address] 12/13/2018, 9:40 AM

## 2018-12-13 NOTE — Patient Instructions (Signed)
Medication Instructions:  Your physician recommends that you continue on your current medications as directed. Please refer to the Current Medication list given to you today. *If you need a refill on your cardiac medications before your next appointment, please call your pharmacy*  Lab Work: FASTING lab work to check metabolic panel & lipid panel -- please complete blood work at [facility] (there are 2 in [place]) and complete before your CT test If you have labs (blood work) drawn today and your tests are completely normal, you will receive your results only by: Marland Kitchen MyChart Message (if you have MyChart) OR . A paper copy in the mail If you have any lab test that is abnormal or we need to change your treatment, we will call you to review the results.  Testing/Procedures: Echo @ [place] Test @ [place]: At [facility], you and your health needs are our priority.  As part of our continuing mission to provide you with exceptional heart care, we have created designated Provider Care Teams.  These Care Teams include your primary Cardiologist (physician) and Advanced Practice Providers (APPs -  Physician Assistants and Nurse Practitioners) who all work together to provide you with the care you need, when you need it.  Your next appointment:   12 month(s)  The format for your next appointment:   In Person  Provider:   K. Mali Hilty, MD  Other Instructions

## 2018-12-15 ENCOUNTER — Encounter: Payer: Self-pay | Admitting: Internal Medicine

## 2018-12-16 ENCOUNTER — Other Ambulatory Visit: Payer: Self-pay | Admitting: Internal Medicine

## 2018-12-16 DIAGNOSIS — I10 Essential (primary) hypertension: Secondary | ICD-10-CM

## 2018-12-16 MED ORDER — METOPROLOL SUCCINATE ER 25 MG PO TB24: ORAL_TABLET | ORAL | 3 refills | Status: DC

## 2018-12-16 NOTE — Telephone Encounter (Signed)
°*  STAT* If patient is at the pharmacy, call can be transferred to refill team.   1. Which medications need to be refilled? (please list name of each medication and dose if known) metoprolol succinate (TOPROL-XL) 25 MG 24 hr tablet  2. Which pharmacy/location (including street and city if local pharmacy) is medication to be sent to? [place]  3. Do they need a 30 day or 90 day supply? 90 day  Prescription was sent to pharmacy with the 30 day supply. They are calling back to request prescription for a 90 day supply instead.

## 2018-12-26 DIAGNOSIS — Z79899 Other long term (current) drug therapy: Secondary | ICD-10-CM | POA: Diagnosis not present

## 2018-12-26 DIAGNOSIS — E785 Hyperlipidemia, unspecified: Secondary | ICD-10-CM | POA: Diagnosis not present

## 2018-12-27 LAB — COMPREHENSIVE METABOLIC PANEL
ALT: 6 IU/L (ref 0–32)
AST: 15 IU/L (ref 0–40)
Albumin/Globulin Ratio: 1.8 (ref 1.2–2.2)
Albumin: 4 g/dL (ref 3.7–4.7)
Alkaline Phosphatase: 78 IU/L (ref 39–117)
BUN/Creatinine Ratio: 19 (ref 12–28)
BUN: 13 mg/dL (ref 8–27)
Bilirubin Total: 0.8 mg/dL (ref 0.0–1.2)
CO2: 25 mmol/L (ref 20–29)
Calcium: 9.2 mg/dL (ref 8.7–10.3)
Chloride: 108 mmol/L — ABNORMAL HIGH (ref 96–106)
Creatinine, Ser: 0.68 mg/dL (ref 0.57–1.00)
GFR calc Af Amer: 100 mL/min/{1.73_m2} (ref >59)
GFR calc non Af Amer: 86 mL/min/{1.73_m2} (ref >59)
Globulin, Total: 2.2 g/dL (ref 1.5–4.5)
Glucose: 88 mg/dL (ref 65–99)
Potassium: 4.6 mmol/L (ref 3.5–5.2)
Sodium: 146 mmol/L — ABNORMAL HIGH (ref 134–144)
Total Protein: 6.2 g/dL (ref 6.0–8.5)

## 2018-12-27 LAB — LIPID PANEL
Chol/HDL Ratio: 2.8 ratio (ref 0.0–4.4)
Cholesterol, Total: 150 mg/dL (ref 100–199)
HDL: 54 mg/dL (ref >39)
LDL Chol Calc (NIH): 67 mg/dL (ref 0–99)
Triglycerides: 170 mg/dL — ABNORMAL HIGH (ref 0–149)
VLDL Cholesterol Cal: 29 mg/dL (ref 5–40)

## 2018-12-31 ENCOUNTER — Other Ambulatory Visit: Payer: Self-pay

## 2018-12-31 ENCOUNTER — Ambulatory Visit (HOSPITAL_COMMUNITY)
Admission: RE | Admit: 2018-12-31 | Discharge: 2018-12-31 | Disposition: A | Discharge status: Home / Self Care | Payer: Medicare Other | Source: Ambulatory Visit | Attending: Internal Medicine | Admitting: Internal Medicine

## 2018-12-31 DIAGNOSIS — K5730 Diverticulosis of large intestine without perforation or abscess without bleeding: Secondary | ICD-10-CM | POA: Diagnosis not present

## 2018-12-31 DIAGNOSIS — Z8679 Personal history of other diseases of the circulatory system: Secondary | ICD-10-CM | POA: Insufficient documentation

## 2018-12-31 DIAGNOSIS — I728 Aneurysm of other specified arteries: Secondary | ICD-10-CM | POA: Diagnosis not present

## 2018-12-31 DIAGNOSIS — I082 Rheumatic disorders of both aortic and tricuspid valves: Secondary | ICD-10-CM

## 2018-12-31 DIAGNOSIS — Z87891 Personal history of nicotine dependence: Secondary | ICD-10-CM | POA: Insufficient documentation

## 2018-12-31 DIAGNOSIS — I119 Hypertensive heart disease without heart failure: Secondary | ICD-10-CM | POA: Diagnosis not present

## 2018-12-31 DIAGNOSIS — Z9889 Other specified postprocedural states: Secondary | ICD-10-CM | POA: Insufficient documentation

## 2018-12-31 DIAGNOSIS — I712 Thoracic aortic aneurysm, without rupture: Secondary | ICD-10-CM | POA: Diagnosis not present

## 2018-12-31 DIAGNOSIS — I714 Abdominal aortic aneurysm, without rupture: Secondary | ICD-10-CM | POA: Diagnosis not present

## 2018-12-31 DIAGNOSIS — K8020 Calculus of gallbladder without cholecystitis without obstruction: Secondary | ICD-10-CM | POA: Insufficient documentation

## 2018-12-31 DIAGNOSIS — Z09 Encounter for follow-up examination after completed treatment for conditions other than malignant neoplasm: Secondary | ICD-10-CM | POA: Insufficient documentation

## 2018-12-31 DIAGNOSIS — E785 Hyperlipidemia, unspecified: Secondary | ICD-10-CM | POA: Insufficient documentation

## 2018-12-31 MED ORDER — IOHEXOL 350 MG/ML SOLN
100.0000 mL | Freq: Once | INTRAVENOUS | Status: AC | PRN
  Administered 2018-12-31: 100 mL via INTRAVENOUS

## 2018-12-31 MED ORDER — IOHEXOL 300 MG/ML  SOLN: 100.0000 mL | Freq: Once | INTRAMUSCULAR | Status: DC | PRN

## 2018-12-31 NOTE — Progress Notes (Signed)
*  PRELIMINARY RESULTS* Echocardiogram 2D Echocardiogram has been performed.  Elizabeth Meza 12/31/2018, 3:06 PM

## 2019-01-09 DIAGNOSIS — I10 Essential (primary) hypertension: Secondary | ICD-10-CM | POA: Diagnosis not present

## 2019-01-09 DIAGNOSIS — I2510 Atherosclerotic heart disease of native coronary artery without angina pectoris: Secondary | ICD-10-CM | POA: Diagnosis not present

## 2019-01-09 DIAGNOSIS — E7849 Other hyperlipidemia: Secondary | ICD-10-CM | POA: Diagnosis not present

## 2019-01-15 ENCOUNTER — Telehealth: Payer: Self-pay | Admitting: Internal Medicine

## 2019-01-15 DIAGNOSIS — I10 Essential (primary) hypertension: Secondary | ICD-10-CM

## 2019-01-15 MED ORDER — METOPROLOL SUCCINATE ER 25 MG PO TB24: ORAL_TABLET | ORAL | 3 refills | Status: DC

## 2019-01-15 MED ORDER — SIMVASTATIN 20 MG PO TABS: ORAL_TABLET | ORAL | 3 refills | Status: DC

## 2019-01-15 NOTE — Telephone Encounter (Signed)
May be cheaper? She can take the equivalent if that is the case.  Dr Lemmie Evens

## 2019-01-15 NOTE — Telephone Encounter (Signed)
LMTCB   Metoprolol succinate was on med list at last 2 OV with MD - unsure why she is requesting change

## 2019-01-15 NOTE — Telephone Encounter (Signed)
New Message  [facility] called regarding the patient Elizabeth Meza. Would like to talk with a nurse regarding patients medication and location  Please call to discuss

## 2019-01-15 NOTE — Telephone Encounter (Signed)
Pt calling stating that she would like for Dr. Debara [place] to send in medication Metoprolol tartrate instead of metoprolol succinate. Pt would like a call back concerning this matter. Please address

## 2019-01-15 NOTE — Telephone Encounter (Signed)
*  STAT* If patient is at the pharmacy, call can be transferred to refill team.   1. Which medications need to be refilled? (please list name of each medication and dose if known) metoprolol succinate (TOPROL-XL) 25 MG 24 hr tablet  2. Which pharmacy/location (including street and city if local pharmacy) is medication to be sent to? [facility] - [place], [place] - [address]  3. Do they need a 30 day or 90 day supply? 90  

## 2019-01-15 NOTE — Telephone Encounter (Signed)
Patient cal use [facility] to get meds shipped to her and they need refills of simvastatin & meto succ. Explained that patient called our office about meto succ and requested change to meto tartrate and we are waiting on call back as to why she request this.

## 2019-01-16 MED ORDER — METOPROLOL SUCCINATE ER 25 MG PO TB24: ORAL_TABLET | ORAL | 3 refills | Status: DC

## 2019-01-16 NOTE — Telephone Encounter (Signed)
Follow Up ° °Patient returning call. Please give a call back.  °

## 2019-01-16 NOTE — Telephone Encounter (Signed)
LMTCB

## 2019-01-16 NOTE — Telephone Encounter (Signed)
Patient requested refill of metoprolol succinate 25mg  to [place]  Rx(s) sent to pharmacy electronically.

## 2019-01-16 NOTE — Telephone Encounter (Signed)
Attempted to return call to patient. No answer.

## 2019-02-09 DIAGNOSIS — I10 Essential (primary) hypertension: Secondary | ICD-10-CM | POA: Diagnosis not present

## 2019-02-09 DIAGNOSIS — E7849 Other hyperlipidemia: Secondary | ICD-10-CM | POA: Diagnosis not present

## 2019-02-09 DIAGNOSIS — I2510 Atherosclerotic heart disease of native coronary artery without angina pectoris: Secondary | ICD-10-CM | POA: Diagnosis not present

## 2019-02-18 DIAGNOSIS — Z961 Presence of intraocular lens: Secondary | ICD-10-CM | POA: Diagnosis not present

## 2019-02-18 DIAGNOSIS — H26493 Other secondary cataract, bilateral: Secondary | ICD-10-CM | POA: Diagnosis not present

## 2019-02-18 DIAGNOSIS — H353131 Nonexudative age-related macular degeneration, bilateral, early dry stage: Secondary | ICD-10-CM | POA: Diagnosis not present

## 2019-03-12 DIAGNOSIS — H26492 Other secondary cataract, left eye: Secondary | ICD-10-CM | POA: Diagnosis not present

## 2019-03-26 DIAGNOSIS — H26491 Other secondary cataract, right eye: Secondary | ICD-10-CM | POA: Diagnosis not present

## 2019-04-03 ENCOUNTER — Ambulatory Visit: Payer: Medicare Other | Attending: Internal Medicine

## 2019-04-03 DIAGNOSIS — Z23 Encounter for immunization: Secondary | ICD-10-CM

## 2019-04-03 NOTE — Progress Notes (Signed)
   Covid-19 Vaccination Clinic  Name:  Elizabeth Meza    MRN: [id number] DOB: Jul 30, 1945  04/03/2019  [name] was observed post Covid-19 immunization for 15 minutes without incident. She was provided with Vaccine Information Sheet and instruction to access the V-Safe system.   [name] was instructed to call 911 with any severe reactions post vaccine: Marland Kitchen Difficulty breathing  . Swelling of face and throat  . A fast heartbeat  . A bad rash all over body  . Dizziness and weakness   Immunizations Administered    Name Date Dose VIS Date Route   Moderna COVID-19 Vaccine 04/03/2019 12:00 PM 0.5 mL 12/10/2018 Intramuscular   Manufacturer: Moderna   Lot: [id number]   [id number]

## 2019-05-01 ENCOUNTER — Ambulatory Visit: Payer: Medicare Other | Attending: Internal Medicine

## 2019-05-01 DIAGNOSIS — Z23 Encounter for immunization: Secondary | ICD-10-CM

## 2019-05-01 NOTE — Progress Notes (Signed)
   Covid-19 Vaccination Clinic  Name:  Elizabeth Meza    MRN: [id number] DOB: 02/11/1945  05/01/2019  Elizabeth Meza was observed post Covid-19 immunization for 15 minutes without incident. She was provided with Vaccine Information Sheet and instruction to access the V-Safe system.   Elizabeth Meza was instructed to call 911 with any severe reactions post vaccine: Marland Kitchen Difficulty breathing  . Swelling of face and throat  . A fast heartbeat  . A bad rash all over body  . Dizziness and weakness   Immunizations Administered    Name Date Dose VIS Date Route   Moderna COVID-19 Vaccine 05/01/2019 11:38 AM 0.5 mL 12/2018 Intramuscular   Manufacturer: Moderna   Lot: [id number]   [id number]

## 2019-05-23 DIAGNOSIS — M810 Age-related osteoporosis without current pathological fracture: Secondary | ICD-10-CM | POA: Diagnosis not present

## 2019-05-23 DIAGNOSIS — I10 Essential (primary) hypertension: Secondary | ICD-10-CM | POA: Diagnosis not present

## 2019-05-23 DIAGNOSIS — E7849 Other hyperlipidemia: Secondary | ICD-10-CM | POA: Diagnosis not present

## 2019-05-23 DIAGNOSIS — R7309 Other abnormal glucose: Secondary | ICD-10-CM | POA: Diagnosis not present

## 2019-05-23 DIAGNOSIS — R7303 Prediabetes: Secondary | ICD-10-CM | POA: Diagnosis not present

## 2019-05-23 DIAGNOSIS — Z0000 Encounter for general adult medical examination without abnormal findings: Secondary | ICD-10-CM | POA: Diagnosis not present

## 2019-05-23 DIAGNOSIS — E782 Mixed hyperlipidemia: Secondary | ICD-10-CM | POA: Diagnosis not present

## 2019-10-09 DIAGNOSIS — E7849 Other hyperlipidemia: Secondary | ICD-10-CM | POA: Diagnosis not present

## 2019-10-09 DIAGNOSIS — I2510 Atherosclerotic heart disease of native coronary artery without angina pectoris: Secondary | ICD-10-CM | POA: Diagnosis not present

## 2019-10-09 DIAGNOSIS — I10 Essential (primary) hypertension: Secondary | ICD-10-CM | POA: Diagnosis not present

## 2019-11-08 DIAGNOSIS — E7849 Other hyperlipidemia: Secondary | ICD-10-CM | POA: Diagnosis not present

## 2019-11-08 DIAGNOSIS — I2510 Atherosclerotic heart disease of native coronary artery without angina pectoris: Secondary | ICD-10-CM | POA: Diagnosis not present

## 2019-11-08 DIAGNOSIS — I10 Essential (primary) hypertension: Secondary | ICD-10-CM | POA: Diagnosis not present

## 2019-12-09 DIAGNOSIS — E7849 Other hyperlipidemia: Secondary | ICD-10-CM | POA: Diagnosis not present

## 2019-12-09 DIAGNOSIS — I10 Essential (primary) hypertension: Secondary | ICD-10-CM | POA: Diagnosis not present

## 2019-12-09 DIAGNOSIS — I2510 Atherosclerotic heart disease of native coronary artery without angina pectoris: Secondary | ICD-10-CM | POA: Diagnosis not present

## 2020-01-05 ENCOUNTER — Other Ambulatory Visit: Payer: Self-pay | Admitting: Internal Medicine

## 2020-01-05 DIAGNOSIS — I10 Essential (primary) hypertension: Secondary | ICD-10-CM

## 2020-01-09 DIAGNOSIS — I10 Essential (primary) hypertension: Secondary | ICD-10-CM | POA: Diagnosis not present

## 2020-01-09 DIAGNOSIS — E7849 Other hyperlipidemia: Secondary | ICD-10-CM | POA: Diagnosis not present

## 2020-01-09 DIAGNOSIS — I2510 Atherosclerotic heart disease of native coronary artery without angina pectoris: Secondary | ICD-10-CM | POA: Diagnosis not present

## 2020-02-06 ENCOUNTER — Telehealth: Payer: Self-pay | Admitting: Internal Medicine

## 2020-02-06 ENCOUNTER — Telehealth: Payer: Self-pay | Admitting: Cardiovascular Disease

## 2020-02-06 NOTE — Telephone Encounter (Signed)
Scheduled patient on 02/09/20 at 3:00 pm with Dr. Ellyn Hack

## 2020-02-09 ENCOUNTER — Encounter: Payer: Self-pay | Admitting: Internal Medicine

## 2020-02-09 ENCOUNTER — Other Ambulatory Visit: Payer: Self-pay

## 2020-02-09 ENCOUNTER — Ambulatory Visit: Payer: Medicare Other | Admitting: Internal Medicine

## 2020-02-09 VITALS — BP 130/56 | HR 62 | Ht 61.0 in | Wt 167.0 lb

## 2020-02-09 DIAGNOSIS — Z8679 Personal history of other diseases of the circulatory system: Secondary | ICD-10-CM

## 2020-02-09 DIAGNOSIS — E785 Hyperlipidemia, unspecified: Secondary | ICD-10-CM

## 2020-02-09 DIAGNOSIS — I10 Essential (primary) hypertension: Secondary | ICD-10-CM

## 2020-02-09 DIAGNOSIS — Z01812 Encounter for preprocedural laboratory examination: Secondary | ICD-10-CM | POA: Diagnosis not present

## 2020-02-09 DIAGNOSIS — Z9889 Other specified postprocedural states: Secondary | ICD-10-CM | POA: Diagnosis not present

## 2020-02-09 NOTE — Patient Instructions (Signed)
Medication Instructions:  Your physician recommends that you continue on your current medications as directed. Please refer to the Current Medication list given to you today.  *If you need a refill on your cardiac medications before your next appointment, please call your pharmacy*   Lab Work: BMET - prior to CT If you have labs (blood work) drawn today and your tests are completely normal, you will receive your results only by: Marland Kitchen MyChart Message (if you have MyChart) OR . A paper copy in the mail If you have any lab test that is abnormal or we need to change your treatment, we will call you to review the results.   Testing/Procedures: CT angiogram chest/abdomen/pelvis @ [facility]   Follow-Up: At [facility], you and your health needs are our priority.  As part of our continuing mission to provide you with exceptional heart care, we have created designated Provider Care Teams.  These Care Teams include your primary Cardiologist (physician) and Advanced Practice Providers (APPs -  Physician Assistants and Nurse Practitioners) who all work together to provide you with the care you need, when you need it.  We recommend signing up for the patient portal called "MyChart".  Sign up information is provided on this After Visit Summary.  MyChart is used to connect with patients for Virtual Visits (Telemedicine).  Patients are able to view lab/test results, encounter notes, upcoming appointments, etc.  Non-urgent messages can be sent to your provider as well.   To learn more about what you can do with MyChart, go to [email or web address].    Your next appointment:   12 month(s)  The format for your next appointment:   In Person  Provider:   You may see Pixie Casino, MD or one of the following Advanced Practice Providers on your designated Care Team:    Almyra Deforest, PA-C  Fabian Sharp, PA-C or   Roby Lofts, [place]    Other Instructions

## 2020-02-09 NOTE — Progress Notes (Signed)
OFFICE NOTE  Chief Complaint:  No complaints  Primary Care Physician: Redmond School, MD  HPI:  Elizabeth Meza  is a 75 year old female with history of temporal arteritis in the past; however, never saw a rheumatologist for that. She also has an abdominal aortic aneurysm measuring up to 5 cm and an ascending aortic aneurysm that measured 4.6 cm on echo. She is a smoker and had been working on smoking cessation, and she quit for a short period of time, however, restarted. She is on Wellbutrin for that. I had referred her to see Dr. Trudie Reed in [facility]; however, she never went to that appointment. Her EF is between 50% to 55% with mild diastolic dysfunction.  Her CT scan demonstrated enlargement of her aneurysms and I referred her to Dr. Roxan Hockey. He evaluated her studies and felt that her repair would be too complex and recommended that she be referred to [facility] to see Dr. Mali Hughes.  She ultimately underwent breath abdominal aneurysm repair by Dr. Ysidro Evert with a long hospitalization, but was discharged returns in followup today. Her surgery was deemed successful, however she still has some residual effects including some weakness in the right leg, back pain, and continued fatigue. Dr. Ysidro Evert recommended reducing her atenolol with a plan to try to wean that off. He was taking this medication for her aneurysms and not hypertension. Unfortunately she continues to smoke. She is also complaining of loss of appetite, weight loss and some constipation.  Finally she had recent laboratory work which shows a well-controlled lipid profile. Her LDL particle number is around 800 with an LDL C. of 50. Renal function and the rest of her metabolic profile were within normal limits.  Elizabeth Meza returns for followup again today. I recently received the data from [facility] including a CT scan which shows stability of her rectal abdominal aortic repair. She has been weaned off of atenolol  but is having some palpitations and PACs. She has the diagnosis of giant cell arteritis which is confirmed by temporal artery biopsy as well as biopsy from the aorta post surgical. She has been followed by Dr. Annye Asa with rheumatology. She's currently not on steroids.  She is planning and getting bilateral cataract surgery in the near future with intraocular lens implant to correct her astigmatism.  Elizabeth Meza returns today for follow-up. She denies any chest pain or worsening shortness of breath. She is having some left shoulder pain which is arthritic in nature. She is scheduled to see Dr. Ysidro Evert back at [facility] in April for follow-up of her aneurysm repairs. I suspect that we will follow her forword for stability of those repairs based on his recommendations. She recently had laboratory work including a lipid profile which showed total cholesterol of 161 and LDL of 86.  I had the pleasure of seeing Elizabeth Meza back in the office today for follow-up. She is complaining of some chest tightness. She says this is been chronic and seems to be on a daily basis ever since her surgery. Interestingly, however she reports that the symptoms get somewhat worse with exertion and are relieved by rest. She effectively doing certain activities because of the chest discomfort. Preoperatively she had cardiac catheterization which showed no obstructive coronary disease performed by myself in 2013.  Elizabeth Meza returns today for follow-up of her chest tightness. In the interim I gave her some isosorbide to try for medical therapy. She says the medicine gave her headaches and did not improve her  chest tightness. As it was mentioned she had no coronary artery disease by cath in 2013 prior to her aneurysm repair. It may be that there is another cause of her chest discomfort. She is however having frequent ventricular ectopy. She is on low-dose atenolol. I gave her Wellbutrin to use for smoking cessation at her request however  she said this made her nauseated. She says that she may do better on the once daily ask our version. She wants to try that. Blood pressure however is not been well controlled and currently is 162/82.  06/10/2015  Elizabeth Meza was seen back today in the office for follow-up. She reports some improvement in her palpitations with switching from atenolol to metoprolol. She has cut back on her smoking but not yet finished it. She is on Wellbutrin XL and was intolerant of short acting Wellbutrin. We talked about continuing this longer however, she says she wishes to wean off of it at this time. I advised her that she may be more likely to go back to smoking but she seemed to dismiss this.  03/16/2016  Elizabeth Meza returns today for follow-up. She has almost stopped smoking. She did use Wellbutrin and had marked improvement in her cigarette use however she says she still has some stress and occasionally will smoke a cigarette. She reports her palpitations have improved and she is unaware of it although she continues to have some PACs. With regards to her prior arthroscopy abdominal aneurysm repair, she did follow-up at [facility] who recommended her to follow-up in 18 months. She is having some pain around her incision site which they thought was related to a scar tissue. Dr. Ysidro Evert also mentioned that she may want to consider going on to some steroids given her history of GCA. She was previously seen Dr. Ouida Sills with rheumatology however he has left the area. She does not currently have a rheumatologist but reports that her primary care provider recently checked sedimentation rate and CRP which were not elevated.  10/05/2017  Elizabeth Meza returns today for follow-up.  Overall she is feeling well denies chest pain or worsening shortness of breath.  She said she ran out of her blood pressure medicine but blood pressure is top normal today.  She remains on the metoprolol.  Cholesterol is also from her previous office visit.  She is  been compliant with simvastatin however LDL is up to 99.  Goal is less than 70.  EKG is stable with sinus rhythm and some sinus arrhythmia.  She is not aware of this.  12/13/2018  Elizabeth Meza returns today for follow-up.  Overall she is doing well without complaints.  Weight has come down further from 188 276 pounds.  She says she is not eating quite as much.  She has not had follow-up with [facility] since 2019.  She had previously undergone thoracoabdominal aneurysm repair.  At her last visit they recommended a follow-up CT angiogram of the chest, abdomen and pelvis as well as an echo.  She is also overdue for labs including a metabolic profile and lipid profile.  Her last labs showed a total cholesterol of 178, triglycerides 139, HDL 51 and LDL of 99 with a goal LDL less than 70.  02/09/2020  Elizabeth Meza is seen today in follow-up.  She is done fairly well since last year.  We did obtain a CT which indicated stable repair of a thoracoabominal aortic aneurysm.  There was perhaps a small increase in the innominate artery demonstrating fusiform  dilatation up to 2.4 cm.  Repeat scan was recommended and has been now over a year.  She remains asymptomatic.  PMHx:  Past Medical History:  Diagnosis Date  . Abdominal aneurysm ([place])   . Abdominal aortic aneurysm (HCC)    5cm & ascending aortic aneursym 4.5cm  . Arthritis   . Carotid artery disease (HCC)    mild - carotid doppler 05/2010: mild amount of fibrous plaque in bilat ICAs, occlusive disease of L vertebral   . History of nuclear stress test 05/2010   bruce myoview; no evidence of ischemia, poor exercise tolerance  . Hypercholesteremia   . Hypertension   . Temporal arteritis (HCC)    giant cell arteritis  . Tobacco abuse     Past Surgical History:  Procedure Laterality Date  . ABDOMINAL AORTIC ANEURYSM REPAIR  02/22/2012   [facility] - supracoronary ascending aortic & hemi-arch replacement for a 6cm ascending aortic & prox arch aneursym  . BREAST SURGERY      biopsyx3  . HEMORROIDECTOMY    . LEFT HEART CATHETERIZATION WITH CORONARY ANGIOGRAM N/A 11/14/2011   Procedure: LEFT HEART CATHETERIZATION WITH CORONARY ANGIOGRAM;  Surgeon: Pixie Casino, MD;  Location: [facility] CATH LAB;  Service: Cardiovascular;  Laterality: N/A;  . TEMPORAL ARTERY BIOPSY / LIGATION  2006  . TRANSTHORACIC ECHOCARDIOGRAM  10/07/2010   EF=50-55%, LV borderline dilated; mild mitral annular calcif; mild TR;   . WRIST SURGERY  2008    FAMHx:  Family History  Problem Relation Age of Onset  . COPD Mother   . Cancer Father   . Heart disease Sister   . Hyperlipidemia Sister        x3  . Hypertension Sister   . Heart disease Brother        hyperlipidemia  . Stroke Maternal Grandfather   . Kidney disease Maternal Grandfather   . Diabetes Maternal Grandmother   . Stroke Paternal Grandmother   . Heart attack Paternal Grandfather   . Parkinson's disease Sister        also diabetes    SOCHx:   reports that she has quit smoking. Her smoking use included cigarettes. She has a 20.00 pack-year smoking history. She has never used smokeless tobacco. She reports that she does not drink alcohol and does not use drugs.  ALLERGIES:  Allergies  Allergen Reactions  . Septra [Sulfamethoxazole-Trimethoprim]   . Sulfa Antibiotics Rash    ROS: Pertinent items noted in HPI and remainder of comprehensive ROS otherwise negative.  HOME MEDS: Current Outpatient Medications  Medication Sig Dispense Refill  . acetaminophen (TYLENOL) 500 MG tablet Take 500 mg by mouth every 6 (six) hours as needed. For pain    . aspirin EC 81 MG tablet Take 81 mg by mouth daily.    . Coenzyme Q10 (CO Q 10 PO) Take by mouth.    . Cyanocobalamin (VITAMIN B-12) 2500 MCG SUBL Place under the tongue daily.    . Magnesium Hydroxide (MAGNESIA PO) Take by mouth.    . Melatonin 10 MG CAPS Take 10 mg by mouth 3 (three) times daily.    . metoprolol succinate (TOPROL-XL) 25 MG 24 hr tablet TAKE ONE (1) TABLET BY  MOUTH EVERY DAY 90 tablet 3  . oxybutynin (DITROPAN) 5 MG/5ML syrup Take 5 mg by mouth 2 (two) times daily.    . simvastatin (ZOCOR) 20 MG tablet TAKE ONE TABLET (20MG  TOTAL) BY MOUTH ATBEDTIME 90 tablet 3   No current facility-administered medications for this visit.  LABS/IMAGING: No results found for this or any previous visit (from the past 48 hour(s)). No results found.  VITALS: BP (!) 130/56 (BP Location: Left Arm, Patient Position: Sitting)   Pulse 62   Ht 5\' 1"  (1.549 m)   Wt 167 lb (75.8 kg)   SpO2 94%   BMI 31.55 kg/m   EXAM: General appearance: alert, no distress and moderately obese Neck: no carotid bruit, no JVD and thyroid not enlarged, symmetric, no tenderness/mass/nodules Lungs: clear to auscultation bilaterally Heart: regular rate and rhythm, S1, S2 normal, no murmur, click, rub or gallop Abdomen: soft, non-tender; bowel sounds normal; no masses,  no organomegaly Extremities: extremities normal, atraumatic, no cyanosis or edema Pulses: 2+ and symmetric Skin: Skin color, texture, turgor normal. No rashes or lesions Neurologic: Grossly normal Psych: Pleasant  EKG: Sinus rhythm with bigeminal PACs at 62, nonspecific ST-T wave changes-personally reviewed  ASSESSMENT: 1. History of thoracic abdominal aneurysm status post aortic aneurysm repair by Dr. Ysidro Evert at [facility] (2014) 2. Tobacco dependence 3. Mild carotid artery disease bilaterally 4. Obesity - ongoing weight loss 5. Dyslipidemia 6. Giant cell arteritis 7. PACs/PVC's - improved on metoprolol  PLAN: 1.   Elizabeth Meza seems to be doing well.  She had a stable CT angiogram over a year ago but a small increase in the innominate artery fusiform dilatation.  This was recommended to be followed up and I would recommend a repeat CT as well.  He was apparently referred to rheumatology due to history of presumed giant cell arteritis however had testing and has not followed up.  We will contact her with the results of  her scan otherwise continue current medications.  Follow-up with me annually or sooner as necessary.  Pixie Casino, MD, [facility], [place] Director of the [facility] Lipid [facility]  Cardiovascular [facility] Diplomate of the American Board of Clinical Lipidology Attending Cardiologist  Direct Dial: [phone]  Fax: [phone]  Website:  [email or web address] 02/09/2020, [id number] PM

## 2020-03-05 DIAGNOSIS — Z01812 Encounter for preprocedural laboratory examination: Secondary | ICD-10-CM | POA: Diagnosis not present

## 2020-03-06 LAB — BASIC METABOLIC PANEL
BUN/Creatinine Ratio: 24 (ref 12–28)
BUN: 21 mg/dL (ref 8–27)
CO2: 23 mmol/L (ref 20–29)
Calcium: 9.2 mg/dL (ref 8.7–10.3)
Chloride: 109 mmol/L — ABNORMAL HIGH (ref 96–106)
Creatinine, Ser: 0.87 mg/dL (ref 0.57–1.00)
GFR calc Af Amer: 75 mL/min/{1.73_m2} (ref >59)
GFR calc non Af Amer: 65 mL/min/{1.73_m2} (ref >59)
Glucose: 74 mg/dL (ref 65–99)
Potassium: 4.6 mmol/L (ref 3.5–5.2)
Sodium: 147 mmol/L — ABNORMAL HIGH (ref 134–144)

## 2020-03-08 DIAGNOSIS — E7849 Other hyperlipidemia: Secondary | ICD-10-CM | POA: Diagnosis not present

## 2020-03-08 DIAGNOSIS — I10 Essential (primary) hypertension: Secondary | ICD-10-CM | POA: Diagnosis not present

## 2020-03-08 DIAGNOSIS — I2510 Atherosclerotic heart disease of native coronary artery without angina pectoris: Secondary | ICD-10-CM | POA: Diagnosis not present

## 2020-03-10 ENCOUNTER — Other Ambulatory Visit: Payer: Self-pay

## 2020-03-10 ENCOUNTER — Ambulatory Visit (HOSPITAL_COMMUNITY)
Admission: RE | Admit: 2020-03-10 | Discharge: 2020-03-10 | Disposition: A | Discharge status: Home / Self Care | Payer: Medicare Other | Source: Ambulatory Visit | Attending: Internal Medicine | Admitting: Internal Medicine

## 2020-03-10 DIAGNOSIS — I712 Thoracic aortic aneurysm, without rupture: Secondary | ICD-10-CM | POA: Diagnosis not present

## 2020-03-10 DIAGNOSIS — I701 Atherosclerosis of renal artery: Secondary | ICD-10-CM | POA: Diagnosis not present

## 2020-03-10 DIAGNOSIS — Z8679 Personal history of other diseases of the circulatory system: Secondary | ICD-10-CM | POA: Diagnosis not present

## 2020-03-10 DIAGNOSIS — J439 Emphysema, unspecified: Secondary | ICD-10-CM | POA: Diagnosis not present

## 2020-03-10 DIAGNOSIS — I2510 Atherosclerotic heart disease of native coronary artery without angina pectoris: Secondary | ICD-10-CM | POA: Diagnosis not present

## 2020-03-10 DIAGNOSIS — I714 Abdominal aortic aneurysm, without rupture: Secondary | ICD-10-CM | POA: Diagnosis not present

## 2020-03-10 DIAGNOSIS — I708 Atherosclerosis of other arteries: Secondary | ICD-10-CM | POA: Diagnosis not present

## 2020-03-10 DIAGNOSIS — Z9889 Other specified postprocedural states: Secondary | ICD-10-CM | POA: Diagnosis not present

## 2020-03-10 MED ORDER — IOHEXOL 350 MG/ML SOLN
100.0000 mL | Freq: Once | INTRAVENOUS | Status: AC | PRN
  Administered 2020-03-10: 100 mL via INTRAVENOUS

## 2020-03-12 ENCOUNTER — Other Ambulatory Visit: Payer: Self-pay | Admitting: *Deleted

## 2020-03-12 DIAGNOSIS — Z8679 Personal history of other diseases of the circulatory system: Secondary | ICD-10-CM

## 2020-03-15 ENCOUNTER — Telehealth: Payer: Self-pay | Admitting: Internal Medicine

## 2020-03-15 NOTE — Telephone Encounter (Signed)
Called pt regarding recent CT angio/abd/pelivs and blood work done prior to study.  Pt given results per Dr. Lysbeth Penner notes. Pt verbalizes understanding and is thankful for the phone call.

## 2020-03-15 NOTE — Telephone Encounter (Signed)
New message:     Patient calling to get some results. Please call patient.

## 2020-05-08 DIAGNOSIS — I2510 Atherosclerotic heart disease of native coronary artery without angina pectoris: Secondary | ICD-10-CM | POA: Diagnosis not present

## 2020-05-08 DIAGNOSIS — I10 Essential (primary) hypertension: Secondary | ICD-10-CM | POA: Diagnosis not present

## 2020-05-08 DIAGNOSIS — E7849 Other hyperlipidemia: Secondary | ICD-10-CM | POA: Diagnosis not present

## 2020-06-08 DIAGNOSIS — I10 Essential (primary) hypertension: Secondary | ICD-10-CM | POA: Diagnosis not present

## 2020-06-08 DIAGNOSIS — E7849 Other hyperlipidemia: Secondary | ICD-10-CM | POA: Diagnosis not present

## 2020-06-08 DIAGNOSIS — I2510 Atherosclerotic heart disease of native coronary artery without angina pectoris: Secondary | ICD-10-CM | POA: Diagnosis not present

## 2020-07-27 ENCOUNTER — Other Ambulatory Visit: Payer: Self-pay | Admitting: Internal Medicine

## 2020-07-27 DIAGNOSIS — L659 Nonscarring hair loss, unspecified: Secondary | ICD-10-CM | POA: Diagnosis not present

## 2020-07-27 DIAGNOSIS — Z139 Encounter for screening, unspecified: Secondary | ICD-10-CM

## 2020-07-27 DIAGNOSIS — E538 Deficiency of other specified B group vitamins: Secondary | ICD-10-CM | POA: Diagnosis not present

## 2020-07-27 DIAGNOSIS — Z0001 Encounter for general adult medical examination with abnormal findings: Secondary | ICD-10-CM | POA: Diagnosis not present

## 2020-07-27 DIAGNOSIS — E039 Hypothyroidism, unspecified: Secondary | ICD-10-CM | POA: Diagnosis not present

## 2020-07-27 DIAGNOSIS — E782 Mixed hyperlipidemia: Secondary | ICD-10-CM | POA: Diagnosis not present

## 2020-07-27 DIAGNOSIS — Z1389 Encounter for screening for other disorder: Secondary | ICD-10-CM | POA: Diagnosis not present

## 2020-07-27 DIAGNOSIS — Z719 Counseling, unspecified: Secondary | ICD-10-CM | POA: Diagnosis not present

## 2020-07-27 DIAGNOSIS — E559 Vitamin D deficiency, unspecified: Secondary | ICD-10-CM | POA: Diagnosis not present

## 2020-07-27 DIAGNOSIS — F17290 Nicotine dependence, other tobacco product, uncomplicated: Secondary | ICD-10-CM | POA: Diagnosis not present

## 2020-07-27 DIAGNOSIS — I10 Essential (primary) hypertension: Secondary | ICD-10-CM | POA: Diagnosis not present

## 2020-07-29 ENCOUNTER — Ambulatory Visit
Admission: RE | Admit: 2020-07-29 | Discharge: 2020-07-29 | Disposition: A | Discharge status: Home / Self Care | Payer: Medicare Other | Source: Ambulatory Visit | Attending: Internal Medicine | Admitting: Internal Medicine

## 2020-07-29 ENCOUNTER — Other Ambulatory Visit: Payer: Self-pay

## 2020-07-29 DIAGNOSIS — Z1231 Encounter for screening mammogram for malignant neoplasm of breast: Secondary | ICD-10-CM | POA: Diagnosis not present

## 2020-07-29 DIAGNOSIS — Z139 Encounter for screening, unspecified: Secondary | ICD-10-CM

## 2020-08-26 DIAGNOSIS — L820 Inflamed seborrheic keratosis: Secondary | ICD-10-CM | POA: Diagnosis not present

## 2020-12-29 DIAGNOSIS — I10 Essential (primary) hypertension: Secondary | ICD-10-CM | POA: Diagnosis not present

## 2020-12-29 DIAGNOSIS — M810 Age-related osteoporosis without current pathological fracture: Secondary | ICD-10-CM | POA: Diagnosis not present

## 2020-12-29 DIAGNOSIS — I8390 Asymptomatic varicose veins of unspecified lower extremity: Secondary | ICD-10-CM | POA: Diagnosis not present

## 2020-12-29 DIAGNOSIS — S8001XA Contusion of right knee, initial encounter: Secondary | ICD-10-CM | POA: Diagnosis not present

## 2021-05-13 DIAGNOSIS — N390 Urinary tract infection, site not specified: Secondary | ICD-10-CM | POA: Diagnosis not present

## 2021-12-02 ENCOUNTER — Ambulatory Visit: Payer: Self-pay

## 2022-01-20 DIAGNOSIS — I700 Atherosclerosis of aorta: Secondary | ICD-10-CM | POA: Diagnosis not present

## 2022-01-20 DIAGNOSIS — D518 Other vitamin B12 deficiency anemias: Secondary | ICD-10-CM | POA: Diagnosis not present

## 2022-01-20 DIAGNOSIS — M316 Other giant cell arteritis: Secondary | ICD-10-CM | POA: Diagnosis not present

## 2022-01-20 DIAGNOSIS — E039 Hypothyroidism, unspecified: Secondary | ICD-10-CM | POA: Diagnosis not present

## 2022-01-20 DIAGNOSIS — Z0001 Encounter for general adult medical examination with abnormal findings: Secondary | ICD-10-CM | POA: Diagnosis not present

## 2022-01-20 DIAGNOSIS — I10 Essential (primary) hypertension: Secondary | ICD-10-CM | POA: Diagnosis not present

## 2022-01-20 DIAGNOSIS — I712 Thoracic aortic aneurysm, without rupture, unspecified: Secondary | ICD-10-CM | POA: Diagnosis not present

## 2022-01-20 DIAGNOSIS — E782 Mixed hyperlipidemia: Secondary | ICD-10-CM | POA: Diagnosis not present

## 2022-01-20 DIAGNOSIS — M810 Age-related osteoporosis without current pathological fracture: Secondary | ICD-10-CM | POA: Diagnosis not present

## 2022-01-20 DIAGNOSIS — E559 Vitamin D deficiency, unspecified: Secondary | ICD-10-CM | POA: Diagnosis not present

## 2023-04-19 DIAGNOSIS — M810 Age-related osteoporosis without current pathological fracture: Secondary | ICD-10-CM | POA: Diagnosis not present

## 2023-04-19 DIAGNOSIS — I700 Atherosclerosis of aorta: Secondary | ICD-10-CM | POA: Diagnosis not present

## 2023-04-19 DIAGNOSIS — I10 Essential (primary) hypertension: Secondary | ICD-10-CM | POA: Diagnosis not present

## 2023-04-19 DIAGNOSIS — Z0001 Encounter for general adult medical examination with abnormal findings: Secondary | ICD-10-CM | POA: Diagnosis not present

## 2023-05-22 DIAGNOSIS — E559 Vitamin D deficiency, unspecified: Secondary | ICD-10-CM | POA: Diagnosis not present

## 2023-05-22 DIAGNOSIS — I10 Essential (primary) hypertension: Secondary | ICD-10-CM | POA: Diagnosis not present

## 2023-05-22 DIAGNOSIS — Z0001 Encounter for general adult medical examination with abnormal findings: Secondary | ICD-10-CM | POA: Diagnosis not present

## 2023-05-22 DIAGNOSIS — R7309 Other abnormal glucose: Secondary | ICD-10-CM | POA: Diagnosis not present

## 2023-06-28 DIAGNOSIS — S0001XA Abrasion of scalp, initial encounter: Secondary | ICD-10-CM | POA: Diagnosis not present

## 2023-07-20 DIAGNOSIS — Z7689 Persons encountering health services in other specified circumstances: Secondary | ICD-10-CM | POA: Diagnosis not present

## 2023-07-20 DIAGNOSIS — Z5320 Procedure and treatment not carried out because of patient's decision for unspecified reasons: Secondary | ICD-10-CM | POA: Diagnosis not present

## 2023-07-20 DIAGNOSIS — E782 Mixed hyperlipidemia: Secondary | ICD-10-CM | POA: Diagnosis not present

## 2023-07-20 DIAGNOSIS — N3281 Overactive bladder: Secondary | ICD-10-CM | POA: Diagnosis not present

## 2023-07-20 DIAGNOSIS — I10 Essential (primary) hypertension: Secondary | ICD-10-CM | POA: Diagnosis not present

## 2023-07-20 DIAGNOSIS — F17210 Nicotine dependence, cigarettes, uncomplicated: Secondary | ICD-10-CM | POA: Diagnosis not present

## 2023-07-23 ENCOUNTER — Other Ambulatory Visit (HOSPITAL_COMMUNITY): Payer: Self-pay

## 2023-07-23 DIAGNOSIS — F17200 Nicotine dependence, unspecified, uncomplicated: Secondary | ICD-10-CM

## 2023-07-27 DIAGNOSIS — N3281 Overactive bladder: Secondary | ICD-10-CM | POA: Diagnosis not present

## 2023-07-27 DIAGNOSIS — E782 Mixed hyperlipidemia: Secondary | ICD-10-CM | POA: Diagnosis not present

## 2023-07-27 DIAGNOSIS — I10 Essential (primary) hypertension: Secondary | ICD-10-CM | POA: Diagnosis not present

## 2023-08-03 DIAGNOSIS — I10 Essential (primary) hypertension: Secondary | ICD-10-CM | POA: Diagnosis not present

## 2023-08-03 DIAGNOSIS — Z5320 Procedure and treatment not carried out because of patient's decision for unspecified reasons: Secondary | ICD-10-CM | POA: Diagnosis not present

## 2023-08-03 DIAGNOSIS — F17200 Nicotine dependence, unspecified, uncomplicated: Secondary | ICD-10-CM | POA: Diagnosis not present

## 2023-08-03 DIAGNOSIS — E782 Mixed hyperlipidemia: Secondary | ICD-10-CM | POA: Diagnosis not present

## 2023-08-03 DIAGNOSIS — N3281 Overactive bladder: Secondary | ICD-10-CM | POA: Diagnosis not present
